# Patient Record
Sex: Male | Born: 1970 | Race: White | Hispanic: No | Marital: Married | Smoking: Current every day smoker
Health system: Southern US, Community
[De-identification: ages and names within clinical notes are randomized; demographics above are authoritative.]

## PROBLEM LIST (undated history)

## (undated) DIAGNOSIS — E079 Disorder of thyroid, unspecified: Secondary | ICD-10-CM

## (undated) DIAGNOSIS — R12 Heartburn: Secondary | ICD-10-CM

## (undated) DIAGNOSIS — M1990 Unspecified osteoarthritis, unspecified site: Secondary | ICD-10-CM

## (undated) DIAGNOSIS — E785 Hyperlipidemia, unspecified: Secondary | ICD-10-CM

## (undated) DIAGNOSIS — F419 Anxiety disorder, unspecified: Secondary | ICD-10-CM

## (undated) HISTORY — DX: Disorder of thyroid, unspecified: E07.9

## (undated) HISTORY — DX: Unspecified osteoarthritis, unspecified site: M19.90

## (undated) HISTORY — DX: Hyperlipidemia, unspecified: E78.5

## (undated) HISTORY — DX: Anxiety disorder, unspecified: F41.9

## (undated) HISTORY — DX: Heartburn: R12

---

## 2011-06-21 ENCOUNTER — Encounter: Payer: Self-pay | Admitting: Student

## 2011-06-21 ENCOUNTER — Emergency Department (HOSPITAL_BASED_OUTPATIENT_CLINIC_OR_DEPARTMENT_OTHER)
Admission: EM | Admit: 2011-06-21 | Discharge: 2011-06-22 | Disposition: A | Discharge status: Home / Self Care | Payer: Self-pay | Attending: Emergency Medicine | Admitting: Emergency Medicine

## 2011-06-21 DIAGNOSIS — S61219A Laceration without foreign body of unspecified finger without damage to nail, initial encounter: Secondary | ICD-10-CM

## 2011-06-21 DIAGNOSIS — W261XXA Contact with sword or dagger, initial encounter: Secondary | ICD-10-CM | POA: Insufficient documentation

## 2011-06-21 DIAGNOSIS — W260XXA Contact with knife, initial encounter: Secondary | ICD-10-CM | POA: Insufficient documentation

## 2011-06-21 DIAGNOSIS — S61209A Unspecified open wound of unspecified finger without damage to nail, initial encounter: Secondary | ICD-10-CM | POA: Insufficient documentation

## 2011-06-21 NOTE — ED Notes (Signed)
Laceration on tip of 5th digit, tetanus not up to date. Wet dressing applied to laceration

## 2011-06-21 NOTE — ED Notes (Signed)
No bleeding at present, area cleaned and finger soaking in betadine soak at this time. Pt states that last tetanus shot was 9 years ago.

## 2011-06-22 MED ORDER — BACITRACIN 500 UNIT/GM EX OINT
1.0000 "application " | TOPICAL_OINTMENT | Freq: Two times a day (BID) | CUTANEOUS | Status: DC
  Administered 2011-06-22: 1 via TOPICAL
  Filled 2011-06-22: qty 0.9

## 2011-06-22 MED ORDER — TETANUS-DIPHTH-ACELL PERTUSSIS 5-2.5-18.5 LF-MCG/0.5 IM SUSP
0.5000 mL | Freq: Once | INTRAMUSCULAR | Status: AC
  Administered 2011-06-22: 0.5 mL via INTRAMUSCULAR

## 2011-06-22 NOTE — ED Provider Notes (Addendum)
History    CC Finger Injury  HPI The patient was reaching into his pocket about 10:30 PM yesterday evening. He had a spring-loaded pocket knife in his pocket which had accidentally opened and he cut the tip of his right fifth finger when placing his hand in his pocket. There was moderate bleeding initially but this resolved with pressure. There is mild to moderate pain at the site worse with palpation. He denies numbness of the distal fingertip. He denies other injury. History reviewed. No pertinent past medical history.  History reviewed. No pertinent past surgical history.  History reviewed. No pertinent family history.  History  Substance Use Topics  . Smoking status: Not on file  . Smokeless tobacco: Not on file  . Alcohol Use: Not on file      Review of Systems  All other systems reviewed and are negative.    Physical Exam  BP 114/93  Pulse 87  Temp(Src) 97.9 F (36.6 C) (Oral)  Resp 16  Wt 215 lb (97.523 kg)  SpO2 100%  Physical Exam General: Well-developed, well-nourished male in no acute distress; appearance consistent with age of record HEENT: normocephalic, atraumatic Eyes: pupils equal round and reactive to light; extraocular muscles intact Neck: supple Heart: regular rate and rhythm; no murmurs, rubs or gallops Lungs: clear to auscultation bilaterally Abdomen: soft; nontender; nondistended; no masses or hepatosplenomegaly; bowel sounds present Extremities: No deformity; full range of motion; pulses normal; approximately 1 cm laceration at the tip of the right fifth finger on the volar side; sensation is intact distally Neurologic: Awake, alert and oriented;motor function intact in all extremities and symmetric;sensation grossly intact; no facial droop Psychiatric: Normal mood and affect   ED Course  LACERATION REPAIR Date/Time: 06/22/2011 1:43 AM Performed by: Hanley Seamen Authorized by: Hanley Seamen Consent: Verbal consent obtained. Consent given  by: patient Time out: Immediately prior to procedure a "time out" was called to verify the correct patient, procedure, equipment, support staff and site/side marked as required. Location: right 5th fingertip. Laceration length: 1 cm Foreign bodies: no foreign bodies Tendon involvement: none Nerve involvement: none Vascular damage: no Anesthesia: local infiltration Local anesthetic: lidocaine 2% with epinephrine Anesthetic total: 1 ml Patient sedated: no Preparation: Patient was prepped and draped in the usual sterile fashion. Irrigation solution: commercial wound irrigant. Irrigation method: jet lavage Amount of cleaning: standard Debridement: none Degree of undermining: none Skin closure: 5-0 nylon Number of sutures: 3 Technique: simple Approximation: close Approximation difficulty: simple Dressing: splint and antibiotic ointment Patient tolerance: Patient tolerated the procedure well with no immediate complications.    MDM       Hanley Seamen, MD 06/22/11 [id number]  Hanley Seamen, MD 07/23/11 [id number]

## 2021-05-07 ENCOUNTER — Other Ambulatory Visit: Payer: Self-pay | Admitting: Family Medicine

## 2021-05-07 ENCOUNTER — Other Ambulatory Visit: Payer: Self-pay

## 2021-05-07 ENCOUNTER — Ambulatory Visit
Admission: RE | Admit: 2021-05-07 | Discharge: 2021-05-07 | Disposition: A | Discharge status: Home / Self Care | Payer: BC Managed Care – PPO | Source: Ambulatory Visit | Attending: Family Medicine | Admitting: Family Medicine

## 2021-05-07 DIAGNOSIS — M2550 Pain in unspecified joint: Secondary | ICD-10-CM

## 2021-09-23 ENCOUNTER — Encounter: Payer: Self-pay | Admitting: Family Medicine

## 2021-09-23 ENCOUNTER — Other Ambulatory Visit: Payer: Self-pay

## 2021-09-23 ENCOUNTER — Ambulatory Visit (INDEPENDENT_AMBULATORY_CARE_PROVIDER_SITE_OTHER): Payer: No Typology Code available for payment source | Admitting: Family Medicine

## 2021-09-23 VITALS — BP 108/76 | HR 73 | Temp 98.1°F | Resp 17 | Ht 70.0 in | Wt 226.8 lb

## 2021-09-23 DIAGNOSIS — E039 Hypothyroidism, unspecified: Secondary | ICD-10-CM

## 2021-09-23 DIAGNOSIS — R002 Palpitations: Secondary | ICD-10-CM | POA: Diagnosis not present

## 2021-09-23 DIAGNOSIS — E782 Mixed hyperlipidemia: Secondary | ICD-10-CM

## 2021-09-23 DIAGNOSIS — F411 Generalized anxiety disorder: Secondary | ICD-10-CM

## 2021-09-23 DIAGNOSIS — R0789 Other chest pain: Secondary | ICD-10-CM | POA: Diagnosis not present

## 2021-09-23 DIAGNOSIS — F17210 Nicotine dependence, cigarettes, uncomplicated: Secondary | ICD-10-CM

## 2021-09-23 DIAGNOSIS — N529 Male erectile dysfunction, unspecified: Secondary | ICD-10-CM

## 2021-09-23 DIAGNOSIS — Z1211 Encounter for screening for malignant neoplasm of colon: Secondary | ICD-10-CM

## 2021-09-23 NOTE — Progress Notes (Signed)
Subjective:  Patient ID: Ryan Kelly, male    DOB: [birth year]  Age: 50 y.o. MRN: [id number]  CC:  Chief Complaint  Patient presents with   Establish Care    Pt here for establish care no concerns notes history of anxiety     HPI Ryan Kelly presents for  New patient to establish care.  Left prior job - new insurance - had to leave prior PCP at [facility] Dr. Daron Kelly.   Generalized anxiety disorder.  Note reviewed from Dr. Daron Kelly in 05/06/21.  Treated with buspirone as needed. Daily a month ago - 2-3 pills when anxious. none in past few weeks. More relaxed recently. Finished remodel of house, ready to place on market. Flips houses now. Prior work in [employer], some hand issues - trigger finger, and hand arthritis - better now. Only occasional symptoms now. Walking and riding bike.   Hypothyroidism: Synthroid 137 mcg per day.  TSH 3.49 on 01/19/21.  Taking medication daily.  No new hot or cold intolerance. No new hair or skin changes, heart palpitations or new fatigue. No new weight changes.   Heart palpitations: Notes with marijuana use. Heart beats hard at times, twinge of chest discomfort - notes with anxiety.  No personal or FH of early heart disease known.  No regular exercise - would like to restart EKG ok few years ok.   Nicotine addiction: 1ppd, no plan to quit at this time. Has tried quitting in past.   Hyperlipidemia: Treated with fenofibrate daily. Past few years. No recent labs.  Lipid profile January 19, 2021, total cholesterol 260, triglycerides 604, LDL 121, HDL 40 No prior statin.   Erectile dysfunction: Takes 50mg  sildenafil  when needed. No CP with exertion or intercourse. No vision. No refill needed.   HM: Colon: grandfather had colon cancer. Requests colonoscopy.  Declines flu vaccine.    History There are no problems to display for this patient.  Past Medical History:  Diagnosis Date   Arthritis    Hyperlipidemia    Thyroid disease     History reviewed. No pertinent surgical history. No Known Allergies Prior to Admission medications   Medication Sig Start Date End Date Taking? Authorizing Provider  busPIRone (BUSPAR) 5 MG tablet Take 1 tablet by mouth daily as needed. 05/06/21  Yes [provider]  fenofibrate micronized (LOFIBRA) 200 MG capsule Take 1 capsule by mouth daily. 02/01/21  Yes [provider]  levothyroxine (SYNTHROID) 137 MCG tablet Take 137 mcg by mouth daily. 11/12/20  Yes [provider]   Social History   Socioeconomic History   Marital status: Single    Spouse name: Not on file   Number of children: Not on file   Years of education: Not on file   Highest education level: Not on file  Occupational History   Not on file  Tobacco Use   Smoking status: Every Day    Types: Cigarettes   Smokeless tobacco: Never  Substance and Sexual Activity   Alcohol use: Not on file   Drug use: Yes    Types: Marijuana   Sexual activity: Yes    Birth control/protection: None  Other Topics Concern   Not on file  Social History Narrative   Not on file   Social Determinants of Health   Financial Resource Strain: Not on file  Food Insecurity: Not on file  Transportation Needs: Not on file  Physical Activity: Not on file  Stress: Not on file  Social Connections: Not on file  Intimate Partner Violence: Not on file    Review of Systems Per HPI   Objective:   Vitals:   09/23/21 1514  BP: 108/76  Pulse: 73  Resp: 17  Temp: 98.1 F (36.7 C)  TempSrc: Temporal  SpO2: 96%  Weight: 226 lb 12.8 oz (102.9 kg)  Height: 5\' 10"  (1.778 m)     Physical Exam Vitals reviewed.  Constitutional:      Appearance: He is well-developed.  HENT:     Head: Normocephalic and atraumatic.  Neck:     Vascular: No carotid bruit or JVD.  Cardiovascular:     Rate and Rhythm: Normal rate and regular rhythm.     Heart sounds: Normal heart sounds. No murmur heard. Pulmonary:     Effort:  Pulmonary effort is normal.     Breath sounds: Normal breath sounds. No rales.  Musculoskeletal:     Right lower leg: No edema.     Left lower leg: No edema.  Skin:    General: Skin is warm and dry.  Neurological:     Mental Status: He is alert and oriented to person, place, and time.  Psychiatric:        Mood and Affect: Mood normal.    EKG: Sinus rhythm, rate 66 bpm.  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  Some flat T waves in lead III, V6.  Previous EKG 10/24/2017 read as normal, but unable to see actual image to compare.  Assessment & Plan:  Ryan Kelly is a 50 y.o. male . Mixed hyperlipidemia - Plan: Comprehensive metabolic panel, Lipid panel  -Updated labs recommended, plans for repeat fasting lab work.  Risks of significant hypertriglyceridemia discussed especially over 500.  May need to adjust doses or add statin.  Palpitations - Plan: EKG 12-Lead, Ambulatory referral to Cardiology, CBC Chest discomfort - Plan: EKG 12-Lead, Ambulatory referral to Cardiology  -Atypical symptoms, fleeting sensations of palpitations, most likely during times of anxiety.  Less likely cardiac cause but does have cardiac risk factors of nicotine addiction, hyperlipidemia.  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  Refer to cardiology, ER/RTC precautions given.  Check labs for palpitations.  Erectile dysfunction, unspecified erectile dysfunction type  - viagra used - use lowest effective dose. Side effects discussed (including but not limited to headache/flushing, blue discoloration of vision, possible vascular steal and risk of cardiac effects if underlying unknown coronary artery disease, and permanent sensorineural hearing loss). Understanding expressed.  GAD (generalized anxiety disorder)  -Recommend trying BuSpar daily, or 5 mg twice daily to help suppress anxiety symptoms, potentially decreasing chest symptoms/palpitations as above.  6-week  follow-up.  Screening for colon cancer - Plan: Ambulatory referral to Gastroenterology  Hypothyroidism, unspecified type - Plan: TSH  -Continue same dose meds, check updated labs, especially with palpitations.  Cigarette nicotine dependence without complication  -Contemplative stage, advised I am happy to help with cessation when he is ready.  No orders of the defined types were placed in this encounter.  Patient Instructions  Please return for fasting labs in the next week. If triglycerides are still high, we need to look at other options.   Continue buspirone - I do recommend daily use  or twice daily consistently for now to see if that lessens chest symptoms.   I will refer you to cardiology, but the chest symptoms less likely due to your heart. If starting back on exercise - start low and go slow.  I will refer you for colonoscopy.   Return to the clinic or go to the nearest emergency room if any of your symptoms worsen or new symptoms occur.  Palpitations Palpitations are feelings that your heartbeat is irregular or is faster than normal. It may feel like your heart is fluttering or skipping a beat. Palpitations are usually not a serious problem. They may be caused by many things, including smoking, caffeine, alcohol, stress, and certain medicines or drugs. Most causes of palpitations are not serious. However, some palpitations can be a sign of a serious problem. You may need further tests to rule out serious medical problems. Follow these instructions at home:   Pay attention to any changes in your condition. Take these actions to help manage your symptoms: Eating and drinking Avoid foods and drinks that may cause palpitations. These may include: Caffeinated coffee, tea, soft drinks, diet pills, and energy drinks. Chocolate. Alcohol. Lifestyle Take steps to reduce your stress and anxiety. Things that can help you relax include: Yoga. Mind-body activities, such as deep  breathing, meditation, or using words and images to create positive thoughts (guided imagery). Physical activity, such as swimming, jogging, or walking. Tell your health care provider if your palpitations increase with activity. If you have chest pain or shortness of breath with activity, do not continue the activity until you are seen by your health care provider. Biofeedback. This is a method that helps you learn to use your mind to control things in your body, such as your heartbeat. Do not use drugs, including cocaine or ecstasy. Do not use marijuana. Get plenty of rest and sleep. Keep a regular bed time. General instructions Take over-the-counter and prescription medicines only as told by your health care provider. Do not use any products that contain nicotine or tobacco, such as cigarettes and e-cigarettes. If you need help quitting, ask your health care provider. Keep all follow-up visits as told by your health care provider. This is important. These may include visits for further testing if palpitations do not go away or get worse. Contact a health care provider if you: Continue to have a fast or irregular heartbeat after 24 hours. Notice that your palpitations occur more often. Get help right away if you: Have chest pain or shortness of breath. Have a severe headache. Feel dizzy or you faint. Summary Palpitations are feelings that your heartbeat is irregular or is faster than normal. It may feel like your heart is fluttering or skipping a beat. Palpitations may be caused by many things, including smoking, caffeine, alcohol, stress, certain medicines, and drugs. Although most causes of palpitations are not serious, some causes can be a sign of a serious medical problem. Get help right away if you faint or have chest pain, shortness of breath, a severe headache, or dizziness. This information is not intended to replace advice given to you by your health care provider. Make sure you  discuss any questions you have with your health care provider. Document Revised: 12/27/2017 Document Reviewed: 01/03/2018 Elsevier Patient Education  2022 [place] Adult After being diagnosed with an anxiety disorder, you may be relieved to know why you have felt or behaved a certain way. You may also feel overwhelmed about the treatment ahead and what it will mean for your life. With care and support, you can manage this condition and recover from it. How to manage lifestyle changes Managing stress and anxiety Stress is your body's reaction to life changes and  events, both good and bad. Most stress will last just a few hours, but stress can be ongoing and can lead to more than just stress. Although stress can play a major role in anxiety, it is not the same as anxiety. Stress is usually caused by something external, such as a deadline, test, or competition. Stress normally passes after the triggering event has ended.  Anxiety is caused by something internal, such as imagining a terrible outcome or worrying that something will go wrong that will devastate you. Anxiety often does not go away even after the triggering event is over, and it can become long-term (chronic) worry. It is important to understand the differences between stress and anxiety and to manage your stress effectively so that it does not lead to an anxious response. Talk with your health care provider or a counselor to learn more about reducing anxiety and stress. He or she may suggest tension reduction techniques, such as: Music therapy. This can include creating or listening to music that you enjoy and that inspires you. Mindfulness-based meditation. This involves being aware of your normal breaths while not trying to control your breathing. It can be done while sitting or walking. Centering prayer. This involves focusing on a word, phrase, or sacred image that means something to you and brings you  peace. Deep breathing. To do this, expand your stomach and inhale slowly through your nose. Hold your breath for 3-5 seconds. Then exhale slowly, letting your stomach muscles relax. Self-talk. This involves identifying thought patterns that lead to anxiety reactions and changing those patterns. Muscle relaxation. This involves tensing muscles and then relaxing them. Choose a tension reduction technique that suits your lifestyle and personality. These techniques take time and practice. Set aside 5-15 minutes a day to do them. Therapists can Kelly counseling and training in these techniques. The training to help with anxiety may be covered by some insurance plans. Other things you can do to manage stress and anxiety include: Keeping a stress/anxiety diary. This can help you learn what triggers your reaction and then learn ways to manage your response. Thinking about how you react to certain situations. You may not be able to control everything, but you can control your response. Making time for activities that help you relax and not feeling guilty about spending your time in this way. Visual imagery and yoga can help you stay calm and relax.  Medicines Medicines can help ease symptoms. Medicines for anxiety include: Anti-anxiety drugs. Antidepressants. Medicines are often used as a primary treatment for anxiety disorder. Medicines will be prescribed by a health care provider. When used together, medicines, psychotherapy, and tension reduction techniques may be the most effective treatment. Relationships Relationships can play a big part in helping you recover. Try to spend more time connecting with trusted friends and family members. Consider going to couples counseling, taking family education classes, or going to family therapy. Therapy can help you and others better understand your condition. How to recognize changes in your anxiety Everyone responds differently to treatment for anxiety. Recovery  from anxiety happens when symptoms decrease and stop interfering with your daily activities at home or work. This may mean that you will start to: Have better concentration and focus. Worry will interfere less in your daily thinking. Sleep better. Be less irritable. Have more energy. Have improved memory. It is important to recognize when your condition is getting worse. Contact your health care provider if your symptoms interfere with home or work and you feel  like your condition is not improving. Follow these instructions at home: Activity Exercise. Most adults should do the following: Exercise for at least 150 minutes each week. The exercise should increase your heart rate and make you sweat (moderate-intensity exercise). Strengthening exercises at least twice a week. Get the right amount and quality of sleep. Most adults need 7-9 hours of sleep each night. Lifestyle  Eat a healthy diet that includes plenty of vegetables, fruits, whole grains, low-fat dairy products, and lean protein. Do not eat a lot of foods that are high in solid fats, added sugars, or salt. Make choices that simplify your life. Do not use any products that contain nicotine or tobacco, such as cigarettes, e-cigarettes, and chewing tobacco. If you need help quitting, ask your health care provider. Avoid caffeine, alcohol, and certain over-the-counter cold medicines. These may make you feel worse. Ask your pharmacist which medicines to avoid. General instructions Take over-the-counter and prescription medicines only as told by your health care provider. Keep all follow-up visits as told by your health care provider. This is important. Where to find support You can get help and support from these sources: Self-help groups. Online and [employer]. A trusted spiritual leader. Couples counseling. Family education classes. Family therapy. Where to find more information You may find that joining a support  group helps you deal with your anxiety. The following sources can help you locate counselors or support groups near you: [place]: www.mentalhealth[place].net Anxiety and Depression Association of [place] (ADAA): https://www.clark.net/ National Alliance on Mental Illness (NAMI): www.nami.org Contact a health care provider if you: Have a hard time staying focused or finishing daily tasks. Spend many hours a day feeling worried about everyday life. Become exhausted by worry. Start to have headaches, feel tense, or have nausea. Urinate more than normal. Have diarrhea. Get help right away if you have: A racing heart and shortness of breath. Thoughts of hurting yourself or others. If you ever feel like you may hurt yourself or others, or have thoughts about taking your own life, get help right away. You can go to your nearest emergency department or call: Your local emergency services (911 in the [place] A suicide crisis helpline, such as the [place] at (954)678-9367. This is open 24 hours a day. Summary Taking steps to learn and use tension reduction techniques can help calm you and help prevent triggering an anxiety reaction. When used together, medicines, psychotherapy, and tension reduction techniques may be the most effective treatment. Family, friends, and partners can play a big part in helping you recover from an anxiety disorder. This information is not intended to replace advice given to you by your health care provider. Make sure you discuss any questions you have with your health care provider. Document Revised: 04/23/2019 Document Reviewed: 04/23/2019 Elsevier Patient Education  2022 [place],   Merri Ray, MD [place] 09/23/21 6:10 PM

## 2021-09-23 NOTE — Patient Instructions (Addendum)
Please return for fasting labs in the next week. If triglycerides are still high, we need to look at other options.   Continue buspirone - I do recommend daily use  or twice daily consistently for now to see if that lessens chest symptoms.   I will refer you to cardiology, but the chest symptoms less likely due to your heart. If starting back on exercise - start low and go slow.   I will refer you for colonoscopy.   Return to the clinic or go to the nearest emergency room if any of your symptoms worsen or new symptoms occur.  Palpitations Palpitations are feelings that your heartbeat is irregular or is faster than normal. It may feel like your heart is fluttering or skipping a beat. Palpitations are usually not a serious problem. They may be caused by many things, including smoking, caffeine, alcohol, stress, and certain medicines or drugs. Most causes of palpitations are not serious. However, some palpitations can be a sign of a serious problem. You may need further tests to rule out serious medical problems. Follow these instructions at home:   Pay attention to any changes in your condition. Take these actions to help manage your symptoms: Eating and drinking Avoid foods and drinks that may cause palpitations. These may include: Caffeinated coffee, tea, soft drinks, diet pills, and energy drinks. Chocolate. Alcohol. Lifestyle Take steps to reduce your stress and anxiety. Things that can help you relax include: Yoga. Mind-body activities, such as deep breathing, meditation, or using words and images to create positive thoughts (guided imagery). Physical activity, such as swimming, jogging, or walking. Tell your health care provider if your palpitations increase with activity. If you have chest pain or shortness of breath with activity, do not continue the activity until you are seen by your health care provider. Biofeedback. This is a method that helps you learn to use your mind to control  things in your body, such as your heartbeat. Do not use drugs, including cocaine or ecstasy. Do not use marijuana. Get plenty of rest and sleep. Keep a regular bed time. General instructions Take over-the-counter and prescription medicines only as told by your health care provider. Do not use any products that contain nicotine or tobacco, such as cigarettes and e-cigarettes. If you need help quitting, ask your health care provider. Keep all follow-up visits as told by your health care provider. This is important. These may include visits for further testing if palpitations do not go away or get worse. Contact a health care provider if you: Continue to have a fast or irregular heartbeat after 24 hours. Notice that your palpitations occur more often. Get help right away if you: Have chest pain or shortness of breath. Have a severe headache. Feel dizzy or you faint. Summary Palpitations are feelings that your heartbeat is irregular or is faster than normal. It may feel like your heart is fluttering or skipping a beat. Palpitations may be caused by many things, including smoking, caffeine, alcohol, stress, certain medicines, and drugs. Although most causes of palpitations are not serious, some causes can be a sign of a serious medical problem. Get help right away if you faint or have chest pain, shortness of breath, a severe headache, or dizziness. This information is not intended to replace advice given to you by your health care provider. Make sure you discuss any questions you have with your health care provider. Document Revised: 12/27/2017 Document Reviewed: 01/03/2018 Elsevier Patient Education  2022 [employer].  Managing Anxiety, Adult After being diagnosed with an anxiety disorder, you may be relieved to know why you have felt or behaved a certain way. You may also feel overwhelmed about the treatment ahead and what it will mean for your life. With care and support, you can manage  this condition and recover from it. How to manage lifestyle changes Managing stress and anxiety Stress is your body's reaction to life changes and events, both good and bad. Most stress will last just a few hours, but stress can be ongoing and can lead to more than just stress. Although stress can play a major role in anxiety, it is not the same as anxiety. Stress is usually caused by something external, such as a deadline, test, or competition. Stress normally passes after the triggering event has ended.  Anxiety is caused by something internal, such as imagining a terrible outcome or worrying that something will go wrong that will devastate you. Anxiety often does not go away even after the triggering event is over, and it can become long-term (chronic) worry. It is important to understand the differences between stress and anxiety and to manage your stress effectively so that it does not lead to an anxious response. Talk with your health care provider or a counselor to learn more about reducing anxiety and stress. He or she may suggest tension reduction techniques, such as: Music therapy. This can include creating or listening to music that you enjoy and that inspires you. Mindfulness-based meditation. This involves being aware of your normal breaths while not trying to control your breathing. It can be done while sitting or walking. Centering prayer. This involves focusing on a word, phrase, or sacred image that means something to you and brings you peace. Deep breathing. To do this, expand your stomach and inhale slowly through your nose. Hold your breath for 3-5 seconds. Then exhale slowly, letting your stomach muscles relax. Self-talk. This involves identifying thought patterns that lead to anxiety reactions and changing those patterns. Muscle relaxation. This involves tensing muscles and then relaxing them. Choose a tension reduction technique that suits your lifestyle and personality. These  techniques take time and practice. Set aside 5-15 minutes a day to do them. Therapists can offer counseling and training in these techniques. The training to help with anxiety may be covered by some insurance plans. Other things you can do to manage stress and anxiety include: Keeping a stress/anxiety diary. This can help you learn what triggers your reaction and then learn ways to manage your response. Thinking about how you react to certain situations. You may not be able to control everything, but you can control your response. Making time for activities that help you relax and not feeling guilty about spending your time in this way. Visual imagery and yoga can help you stay calm and relax.  Medicines Medicines can help ease symptoms. Medicines for anxiety include: Anti-anxiety drugs. Antidepressants. Medicines are often used as a primary treatment for anxiety disorder. Medicines will be prescribed by a health care provider. When used together, medicines, psychotherapy, and tension reduction techniques may be the most effective treatment. Relationships Relationships can play a big part in helping you recover. Try to spend more time connecting with trusted friends and family members. Consider going to couples counseling, taking family education classes, or going to family therapy. Therapy can help you and others better understand your condition. How to recognize changes in your anxiety Everyone responds differently to treatment for anxiety. Recovery from anxiety happens  when symptoms decrease and stop interfering with your daily activities at home or work. This may mean that you will start to: Have better concentration and focus. Worry will interfere less in your daily thinking. Sleep better. Be less irritable. Have more energy. Have improved memory. It is important to recognize when your condition is getting worse. Contact your health care provider if your symptoms interfere with home or work  and you feel like your condition is not improving. Follow these instructions at home: Activity Exercise. Most adults should do the following: Exercise for at least 150 minutes each week. The exercise should increase your heart rate and make you sweat (moderate-intensity exercise). Strengthening exercises at least twice a week. Get the right amount and quality of sleep. Most adults need 7-9 hours of sleep each night. Lifestyle  Eat a healthy diet that includes plenty of vegetables, fruits, whole grains, low-fat dairy products, and lean protein. Do not eat a lot of foods that are high in solid fats, added sugars, or salt. Make choices that simplify your life. Do not use any products that contain nicotine or tobacco, such as cigarettes, e-cigarettes, and chewing tobacco. If you need help quitting, ask your health care provider. Avoid caffeine, alcohol, and certain over-the-counter cold medicines. These may make you feel worse. Ask your pharmacist which medicines to avoid. General instructions Take over-the-counter and prescription medicines only as told by your health care provider. Keep all follow-up visits as told by your health care provider. This is important. Where to find support You can get help and support from these sources: Self-help groups. Online and [employer]. A trusted spiritual leader. Couples counseling. Family education classes. Family therapy. Where to find more information You may find that joining a support group helps you deal with your anxiety. The following sources can help you locate counselors or support groups near you: [place]: www.mentalhealthamerica.net Anxiety and Depression Association of [place] (ADAA): https://www.clark.net/ National Alliance on Mental Illness (NAMI): www.nami.org Contact a health care provider if you: Have a hard time staying focused or finishing daily tasks. Spend many hours a day feeling worried about everyday  life. Become exhausted by worry. Start to have headaches, feel tense, or have nausea. Urinate more than normal. Have diarrhea. Get help right away if you have: A racing heart and shortness of breath. Thoughts of hurting yourself or others. If you ever feel like you may hurt yourself or others, or have thoughts about taking your own life, get help right away. You can go to your nearest emergency department or call: Your local emergency services (911 in the [place] A suicide crisis helpline, such as the [place] at (229)840-7978. This is open 24 hours a day. Summary Taking steps to learn and use tension reduction techniques can help calm you and help prevent triggering an anxiety reaction. When used together, medicines, psychotherapy, and tension reduction techniques may be the most effective treatment. Family, friends, and partners can play a big part in helping you recover from an anxiety disorder. This information is not intended to replace advice given to you by your health care provider. Make sure you discuss any questions you have with your health care provider. Document Revised: 04/23/2019 Document Reviewed: 04/23/2019 Elsevier Patient Education  [place].

## 2021-09-30 ENCOUNTER — Other Ambulatory Visit (INDEPENDENT_AMBULATORY_CARE_PROVIDER_SITE_OTHER): Payer: No Typology Code available for payment source

## 2021-09-30 ENCOUNTER — Other Ambulatory Visit: Payer: Self-pay

## 2021-09-30 DIAGNOSIS — R002 Palpitations: Secondary | ICD-10-CM | POA: Diagnosis not present

## 2021-09-30 DIAGNOSIS — E782 Mixed hyperlipidemia: Secondary | ICD-10-CM | POA: Diagnosis not present

## 2021-09-30 DIAGNOSIS — E039 Hypothyroidism, unspecified: Secondary | ICD-10-CM

## 2021-09-30 LAB — TSH: TSH: 6.05 u[IU]/mL — ABNORMAL HIGH (ref 0.35–5.50)

## 2021-09-30 LAB — COMPREHENSIVE METABOLIC PANEL
ALT: 34 U/L (ref 0–53)
AST: 25 U/L (ref 0–37)
Albumin: 4.6 g/dL (ref 3.5–5.2)
Alkaline Phosphatase: 33 U/L — ABNORMAL LOW (ref 39–117)
BUN: 17 mg/dL (ref 6–23)
CO2: 25 mEq/L (ref 19–32)
Calcium: 9.3 mg/dL (ref 8.4–10.5)
Chloride: 104 mEq/L (ref 96–112)
Creatinine, Ser: 0.99 mg/dL (ref 0.40–1.50)
GFR: 89.79 mL/min (ref >60.00)
Glucose, Bld: 83 mg/dL (ref 70–99)
Potassium: 4 mEq/L (ref 3.5–5.1)
Sodium: 138 mEq/L (ref 135–145)
Total Bilirubin: 0.6 mg/dL (ref 0.2–1.2)
Total Protein: 7.2 g/dL (ref 6.0–8.3)

## 2021-09-30 LAB — LIPID PANEL
Cholesterol: 214 mg/dL — ABNORMAL HIGH (ref 0–200)
HDL: 55.5 mg/dL (ref >39.00)
NonHDL: 158.61
Total CHOL/HDL Ratio: 4
Triglycerides: 246 mg/dL — ABNORMAL HIGH (ref 0.0–149.0)
VLDL: 49.2 mg/dL — ABNORMAL HIGH (ref 0.0–40.0)

## 2021-09-30 LAB — CBC
HCT: 42.9 % (ref 39.0–52.0)
Hemoglobin: 14.7 g/dL (ref 13.0–17.0)
MCHC: 34.2 g/dL (ref 30.0–36.0)
MCV: 89.9 fl (ref 78.0–100.0)
Platelets: 229 10*3/uL (ref 150.0–400.0)
RBC: 4.77 Mil/uL (ref 4.22–5.81)
RDW: 13.3 % (ref 11.5–15.5)
WBC: 7.8 10*3/uL (ref 4.0–10.5)

## 2021-09-30 LAB — LDL CHOLESTEROL, DIRECT: Direct LDL: 131 mg/dL

## 2021-10-07 ENCOUNTER — Encounter: Payer: Self-pay | Admitting: Gastroenterology

## 2021-10-27 ENCOUNTER — Encounter: Payer: Self-pay | Admitting: Internal Medicine

## 2021-10-27 ENCOUNTER — Other Ambulatory Visit: Payer: Self-pay

## 2021-10-27 ENCOUNTER — Ambulatory Visit (INDEPENDENT_AMBULATORY_CARE_PROVIDER_SITE_OTHER): Payer: No Typology Code available for payment source | Admitting: Internal Medicine

## 2021-10-27 VITALS — BP 110/77 | Ht 70.0 in | Wt 232.0 lb

## 2021-10-27 DIAGNOSIS — R0789 Other chest pain: Secondary | ICD-10-CM

## 2021-10-27 DIAGNOSIS — R002 Palpitations: Secondary | ICD-10-CM | POA: Diagnosis not present

## 2021-10-27 NOTE — Patient Instructions (Signed)
Medication Instructions:  Your physician recommends that you continue on your current medications as directed. Please refer to the Current Medication list given to you today.  *If you need a refill on your cardiac medications before your next appointment, please call your pharmacy*   Follow-Up: At [facility], you and your health needs are our priority.  As part of our continuing mission to provide you with exceptional heart care, we have created designated Provider Care Teams.  These Care Teams include your primary Cardiologist (physician) and Advanced Practice Providers (APPs -  Physician Assistants and Nurse Practitioners) who all work together to provide you with the care you need, when you need it.  We recommend signing up for the patient portal called "MyChart".  Sign up information is provided on this After Visit Summary.  MyChart is used to connect with patients for Virtual Visits (Telemedicine).  Patients are able to view lab/test results, encounter notes, upcoming appointments, etc.  Non-urgent messages can be sent to your provider as well.   To learn more about what you can do with MyChart, go to [email or web address].    Your next appointment:   We will see you on an as needed basis.  Provider:   Mary Branch, MD 

## 2021-10-27 NOTE — Progress Notes (Signed)
Cardiology Office Note:    Date:  10/27/2021   ID:  Arnoldo Morale, DOB [id number], MRN [id number]  PCP:  Wendie Agreste, MD   [facility] Providers Cardiologist:  None     Referring MD: Wendie Agreste, MD   No chief complaint on file. Chest pain  History of Present Illness:    Ryan Kelly is a 50 y.o. 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 It's been going on for a year. The pain lasts for a second on the L side. He noted it with panic attacks. He knew someone younger than him that died of a heart attack. He noticed it with MJ. It occurs in the evening. Non exertional. No dyspnea. He did a seven mile hike with no dyspnea. When riding his bike he does not notice it. 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  He has no cardiac dx hx. No LH, no syncope. No persistent dizziness. No hx of hypertension. He smokes cigarettes he is not working on quitting.  Mother has diabetes. Father is healthy. He has a brother and he is healthy.  CVD Risk/Equivalent: HLD- yes LDL 121, HDL 40 HTN- no PAD- No DMII no Smoker-yes Stroke-no Premature Family History- no   Past Medical History:  Diagnosis Date   Arthritis    Hyperlipidemia    Thyroid disease     No past surgical history on file.  Current Medications: No outpatient medications have been marked as taking for the 10/27/21 encounter (Appointment) with Janina Mayo, MD.     Allergies:   Patient has no known allergies.   Social History   Socioeconomic History   Marital status: Single    Spouse name: Not on file   Number of children: Not on file   Years of education: Not on file   Highest education level: Not on file  Occupational History   Not on file  Tobacco Use   Smoking status: Every Day    Types: Cigarettes    Smokeless tobacco: Never  Substance and Sexual Activity   Alcohol use: Not on file   Drug use: Yes    Types: Marijuana   Sexual activity: Yes    Birth control/protection: None  Other Topics Concern   Not on file  Social History Narrative   Not on file   Social Determinants of Health   Financial Resource Strain: Not on file  Food Insecurity: Not on file  Transportation Needs: Not on file  Physical Activity: Not on file  Stress: Not on file  Social Connections: Not on file     Family History: The patient's family history includes Birth defects in his maternal grandmother and paternal grandmother; Diabetes in his mother.  ROS:   Please see the history of present illness.     All other systems reviewed and are negative.  EKGs/Labs/Other Studies Reviewed:    The following studies were reviewed today:   EKG:  EKG is  ordered today.  The ekg ordered today demonstrates   NSR, no ischemic change  Recent Labs: 09/30/2021: ALT 34; BUN 17; Creatinine, Ser 0.99; Hemoglobin 14.7; Platelets 229.0; Potassium 4.0; Sodium 138; TSH 6.05  Recent Lipid Panel    Component Value Date/Time   CHOL 214 (H) 09/30/2021 0840   TRIG 246.0 (H) 09/30/2021 0840   HDL  55.50 09/30/2021 0840   CHOLHDL 4 09/30/2021 0840   VLDL 49.2 (H) 09/30/2021 0840   LDLDIRECT 131.0 09/30/2021 0840     Risk Assessment/Calculations:     The 10-year ASCVD risk score (Arnett DK, et al., 2019) is: 5.5%   Values used to calculate the score:     Age: 50 years     Sex: Male     Is Non-Hispanic African American: No     Diabetic: No     Tobacco smoker: Yes     Systolic Blood Pressure: 155 mmHg     Is BP treated: No     HDL Cholesterol: 55.5 mg/dL     Total Cholesterol: 214 mg/dL       Physical Exam:    VS:  There were no vitals taken for this visit.    Wt Readings from Last 3 Encounters:  09/23/21 226 lb 12.8 oz (102.9 kg)  06/21/11 215 lb (97.5 kg)     GEN:  Well nourished, well developed in no  acute distress HEENT: Normal NECK: No JVD; No carotid bruits LYMPHATICS: No lymphadenopathy CARDIAC: RRR, no murmurs, rubs, gallops RESPIRATORY:  Clear to auscultation without rales, wheezing or rhonchi  ABDOMEN: Soft, non-tender, non-distended MUSCULOSKELETAL:  No edema; No deformity  SKIN: Warm and dry NEUROLOGIC:  Alert and oriented x 3 PSYCHIATRIC:  Normal affect   ASSESSMENT:    #Atypical Chest pain: His CVD risk is low and he does not have high risk features including exertional angina or dyspnea. I discussed symptoms of anginal chest pain. His pain is atypical c/w MSK pain    PLAN:    In order of problems listed above:  Follow up PRN           Medication Adjustments/Labs and Tests Ordered: Current medicines are reviewed at length with the patient today.  Concerns regarding medicines are outlined above.  No orders of the defined types were placed in this encounter.  No orders of the defined types were placed in this encounter.   There are no Patient Instructions on file for this visit.   Signed, Janina Mayo, MD  10/27/2021 8:15 AM    [facility]

## 2021-11-05 ENCOUNTER — Encounter: Payer: Self-pay | Admitting: Family Medicine

## 2021-11-05 ENCOUNTER — Ambulatory Visit (INDEPENDENT_AMBULATORY_CARE_PROVIDER_SITE_OTHER): Payer: No Typology Code available for payment source | Admitting: Family Medicine

## 2021-11-05 VITALS — BP 118/60 | HR 72 | Temp 98.1°F | Resp 17 | Ht 70.0 in | Wt 234.2 lb

## 2021-11-05 DIAGNOSIS — R002 Palpitations: Secondary | ICD-10-CM | POA: Diagnosis not present

## 2021-11-05 DIAGNOSIS — E782 Mixed hyperlipidemia: Secondary | ICD-10-CM

## 2021-11-05 DIAGNOSIS — E781 Pure hyperglyceridemia: Secondary | ICD-10-CM | POA: Diagnosis not present

## 2021-11-05 DIAGNOSIS — E039 Hypothyroidism, unspecified: Secondary | ICD-10-CM

## 2021-11-05 DIAGNOSIS — F411 Generalized anxiety disorder: Secondary | ICD-10-CM

## 2021-11-05 MED ORDER — FENOFIBRATE MICRONIZED 200 MG PO CAPS
200.0000 mg | ORAL_CAPSULE | Freq: Every day | ORAL | 1 refills | Status: DC
Start: 2021-11-05 — End: 2022-03-07

## 2021-11-05 MED ORDER — LEVOTHYROXINE SODIUM 137 MCG PO TABS
137.0000 ug | ORAL_TABLET | Freq: Every day | ORAL | 1 refills | Status: DC
Start: 2021-11-05 — End: 2022-03-07

## 2021-11-05 MED ORDER — BUSPIRONE HCL 5 MG PO TABS: 5.0000 mg | ORAL_TABLET | Freq: Every day | ORAL | 1 refills | Status: DC | PRN

## 2021-11-05 NOTE — Patient Instructions (Addendum)
Ok to use daily Buspar, and if anxiety increases can take twice per day.   Same dose thyroid med for now - recheck levels at lab only visit in 1 month. Follow up with me in 4 months - fasting labs at that time.   Exercise (start low and go slow) and diet changes may help triglycerides - no med changes for now.   Let me know if any new symptoms. Take care.   High Triglycerides Eating Plan Triglycerides are a type of fat in the blood. High levels of triglycerides can increase your risk of heart disease and stroke. If your triglyceride levels are high, choosing the right foods can help lower your triglycerides and keep your heart healthy. Work with your health care provider or a dietitian to develop an eating plan that is right for you. What are tips for following this plan? General guidelines  Lose weight, if you are overweight. For most people, losing 5-10 lb (2-5 kg) helps lower triglyceride levels. A weight-loss plan may include: 30 minutes of exercise at least 5 days a week. Reducing the amount of calories, sugar, and fat you eat. Eat a wide variety of fresh fruits, vegetables, and whole grains. These foods are high in fiber. Eat foods that contain healthy fats, such as fatty fish, nuts, seeds, and olive oil. Avoid foods that are high in added sugar, added salt (sodium), and saturated fat. Avoid low-fiber, refined carbohydrates such as white bread, crackers, noodles, and white rice. Avoid foods with trans fats or partially hydrogenated oils, such as fried foods or stick margarine. If you drink alcohol: Limit how much you have to: 0-1 drink a day for women who are not pregnant. 0-2 drinks a day for men. Your health care provider may recommend that you drink less than these amounts depending on your overall health. Know how much alcohol is in a drink. In the [place] one drink equals one 12 oz bottle of beer (355 mL), one 5 oz glass of wine (148 mL), or one 1 oz glass of hard liquor (44  mL). Reading food labels Check food labels for: The amount of saturated fat. Choose foods with no or very little saturated fat (less than 2 g). The amount of trans fat. Choose foods with no transfat. The amount of cholesterol. Choose foods that are low in cholesterol. The amount of sodium. Choose foods with less than 140 milligrams (mg) per serving. Shopping Buy dairy products labeled as nonfat (skim) or low-fat (1%). Avoid buying processed or prepackaged foods. These are often high in added sugar, sodium, and fat. Cooking Choose healthy fats when cooking, such as olive oil, avocado oil, or canola oil. Cook foods using lower fat methods, such as baking, broiling, boiling, or grilling. Make your own sauces, dressings, and marinades when possible, instead of buying them. Store-bought sauces, dressings, and marinades are often high in sodium and sugar. Meal planning Eat more home-cooked food and less restaurant, buffet, and fast food. Eat fatty fish at least 2 times each week. Examples of fatty fish include salmon, trout, sardines, mackerel, tuna, and herring. If you eat whole eggs, do not eat more than 4 egg yolks per week. What foods should I eat? Fruits All fresh, canned (in natural juice), or frozen fruits. Vegetables Fresh or frozen vegetables. Low-sodium canned vegetables. Grains Whole wheat or whole grain breads, crackers, cereals, and pasta. Unsweetened oatmeal. Bulgur. Barley. Quinoa. Brown rice. Whole wheat flour tortillas. Meats and other proteins Skinless chicken or [place]. Ground chicken  or [place]. Lean cuts of pork, trimmed of fat. Fish and seafood, especially salmon, trout, and herring. Egg whites. Dried beans, peas, or lentils. Unsalted nuts or seeds. Unsalted canned beans. Natural peanut or almond butter or other nut butters. Dairy Low-fat dairy products. Skim or low-fat (1%) milk. Reduced fat (2%) and low-sodium cheese. Low-fat ricotta cheese. Low-fat cottage cheese. Plain,  low-fat yogurt. Fats and oils Tub margarine without trans fats. Light or reduced-fat mayonnaise. Light or reduced-fat salad dressings. Avocado. Safflower, olive, sunflower, soybean, and canola oils. The items listed above may not be a complete list of recommended foods and beverages. Talk with your dietitian about what dietary choices are best for you. What foods should I avoid? Fruits Sweetened dried fruit. Canned fruit in syrup. Fruit juice. Vegetables Creamed or fried vegetables. Vegetables in a cheese sauce. Grains White bread. White (regular) pasta. White rice. Cornbread. Bagels. Pastries. Crackers that contain trans fat. Meats and other proteins Fatty cuts of meat. Ribs. Chicken wings. Berniece Salines. Sausage. Bologna. Salami. Chitterlings. Fatback. Hot dogs. Bratwurst. Packaged lunch meats. Dairy Whole or reduced-fat (2%) milk. Half-and-half. Cream cheese. Full-fat or sweetened yogurt. Full-fat cheese. Nondairy creamers. Whipped toppings. Processed cheese or cheese spreads. Cheese curds. Fats and oils Butter. Stick margarine. Lard. Shortening. Ghee. Bacon fat. Tropical oils, such as coconut, palm kernel, or palm oils. Beverages Alcohol. Sweetened drinks, such as soda, lemonade, fruit drinks, or punches. Sweets and desserts Corn syrup. Sugars. Honey. Molasses. Candy. Jam and jelly. Syrup. Sweetened cereals. Cookies. Pies. Cakes. Donuts. Muffins. Ice cream. Condiments Store-bought sauces, dressings, and marinades that are high in sugar, such as ketchup and barbecue sauce. The items listed above may not be a complete list of foods and beverages you should avoid. Talk with your dietitian about what dietary choices are best for you. Summary High levels of triglycerides can increase the risk of heart disease and stroke. Choosing the right foods can help lower your triglycerides. Eat plenty of fresh fruits, vegetables, and whole grains. Choose low-fat dairy and lean meats. Eat fatty fish at least  twice a week. Avoid processed and prepackaged foods with added sugar, sodium, saturated fat, and trans fat. If you need suggestions or have questions about what types of food are good for you, talk with your health care provider or a dietitian. This information is not intended to replace advice given to you by your health care provider. Make sure you discuss any questions you have with your health care provider. Document Revised: 04/02/2021 Document Reviewed: 04/02/2021 Elsevier Patient Education  [place].

## 2021-11-05 NOTE — Progress Notes (Signed)
Subjective:  Patient ID: Ryan Kelly, male    DOB: [birth year]  Age: 50 y.o. MRN: [id number]  CC:  Chief Complaint  Patient presents with   Anxiety    Pt reports doing okay, here for follow up today no questions today due for refills would like you to fill    Palpitations    Pt reports has not had them that he has noticed    Hypothyroidism    Pt due for refill would like this sent in, reports he is running low TSH check ed end of Oct.     HPI   Ryan Kelly presents for  Follow-up from establish care visit October 20.  Generalized anxiety disorder Discussed last visit along with palpitations, atypical chest discomfort.  Mostly during times of anxiety, less likely cardiac cause.  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  Recommended he take BuSpar daily or 5 mg twice daily to suppress anxiety symptoms. Palpitations have improved.Taking Buspar more frequently - daily initially. sometimes forgets more relaxed. No chest pains. Less use past week - off few days. Running low. No side effects with Buspar at this dosing.  Cardiology eval 11/23 - low CVD risk. Atypical pain - MSK source likely. Follow up prn.   Hypothyroidism: Lab Results  Component Value Date   TSH 6.05 (H) 09/30/2021  TSH borderline elevated at his October 27 labs.  At that time he was taking Synthroid 137 mcg daily. Taking medication daily.  No new hot or cold intolerance. No new hair or skin changes, heart palpitations or new fatigue. No new weight changes - overall stable. Would like to remain on same dose meds for now.   Hyperlipidemia: On fenofibrate. Declines statin - would like to try some diet changes  - room for improvement.  Lab Results  Component Value Date   CHOL 214 (H) 09/30/2021   HDL 55.50 09/30/2021   LDLDIRECT 131.0 09/30/2021   TRIG 246.0 (H) 09/30/2021   CHOLHDL 4 09/30/2021   Lab Results  Component Value Date   ALT 34 09/30/2021    AST 25 09/30/2021   ALKPHOS 33 (L) 09/30/2021   BILITOT 0.6 09/30/2021    Declines flu vaccine, colonoscopy scheduled.     History There are no problems to display for this patient.  Past Medical History:  Diagnosis Date   Arthritis    Hyperlipidemia    Thyroid disease    History reviewed. No pertinent surgical history. No Known Allergies Prior to Admission medications   Medication Sig Start Date End Date Taking? Authorizing Provider  busPIRone (BUSPAR) 5 MG tablet Take 1 tablet by mouth daily as needed. 05/06/21  Yes [provider]  fenofibrate micronized (LOFIBRA) 200 MG capsule Take 1 capsule by mouth daily. 02/01/21  Yes [provider]  levothyroxine (SYNTHROID) 137 MCG tablet Take 137 mcg by mouth daily. 11/12/20  Yes [provider]  sildenafil (VIAGRA) 100 MG tablet Take half a tablet (50mg ) one hour prior to sexual activity. 05/06/21  Yes [provider]   Social History   Socioeconomic History   Marital status: Single    Spouse name: Not on file   Number of children: Not on file   Years of education: Not on file   Highest education level: Not on file  Occupational History   Not on file  Tobacco Use   Smoking status: Every Day    Types: Cigarettes   Smokeless tobacco: Never  Substance  and Sexual Activity   Alcohol use: Not on file   Drug use: Yes    Types: Marijuana   Sexual activity: Yes    Birth control/protection: None  Other Topics Concern   Not on file  Social History Narrative   Not on file   Social Determinants of Health   Financial Resource Strain: Not on file  Food Insecurity: Not on file  Transportation Needs: Not on file  Physical Activity: Not on file  Stress: Not on file  Social Connections: Not on file  Intimate Partner Violence: Not on file    Review of Systems  Constitutional:  Negative for fatigue and unexpected weight change.  Eyes:  Negative for visual disturbance.  Respiratory:  Negative  for cough, chest tightness and shortness of breath.   Cardiovascular:  Negative for chest pain, palpitations and leg swelling.  Gastrointestinal:  Negative for abdominal pain and blood in stool.  Neurological:  Negative for dizziness, light-headedness and headaches.    Objective:   Vitals:   11/05/21 1201  BP: 118/60  Pulse: 72  Resp: 17  Temp: 98.1 F (36.7 C)  TempSrc: Temporal  SpO2: 97%  Weight: 234 lb 3.2 oz (106.2 kg)  Height: 5\' 10"  (1.778 m)     Physical Exam Vitals reviewed.  Constitutional:      Appearance: He is well-developed.  HENT:     Head: Normocephalic and atraumatic.  Neck:     Vascular: No carotid bruit or JVD.     Comments: No thyromegaly/nodule/mass.  Cardiovascular:     Rate and Rhythm: Normal rate and regular rhythm.     Heart sounds: Normal heart sounds. No murmur heard. Pulmonary:     Effort: Pulmonary effort is normal.     Breath sounds: Normal breath sounds. No rales.  Musculoskeletal:     Right lower leg: No edema.     Left lower leg: No edema.  Skin:    General: Skin is warm and dry.  Neurological:     Mental Status: He is alert and oriented to person, place, and time.  Psychiatric:        Mood and Affect: Mood normal.       Assessment & Plan:  Ryan Kelly is a 50 y.o. male . Hypothyroidism, unspecified type - Plan: TSH, T4, free, levothyroxine (SYNTHROID) 137 MCG tablet  -Borderline elevated TSH but asymptomatic.  Decided to remain on same dose of Synthroid with a repeat lab in 1 month with free T4.  RTC precautions if new symptoms.  Palpitations  Now resolved, likely anxiety related.  RTC precautions.  Reassuring cardiology eval.  Mixed hyperlipidemia - Plan: fenofibrate micronized (LOFIBRA) 200 MG capsule Hypertriglyceridemia - Plan: fenofibrate micronized (LOFIBRA) 200 MG capsule  -Continue same dose of Lofibra for now.  We did discuss statins but would like to hold on that change at this time.  Some room for dietary  improvement as well as exercise.  Handout given on diet with hypertriglyceridemia, recheck 4 months with fasting labs.  GAD (generalized anxiety disorder) - Plan: busPIRone (BUSPAR) 5 MG tablet  -Improved control, continue BuSpar with daily dosing discussed, twice daily dosing if needed.  Recheck 4 months.  Meds ordered this encounter  Medications   fenofibrate micronized (LOFIBRA) 200 MG capsule    Sig: Take 1 capsule (200 mg total) by mouth daily.    Dispense:  90 capsule    Refill:  1   busPIRone (BUSPAR) 5 MG tablet    Sig: Take 1  tablet (5 mg total) by mouth daily as needed.    Dispense:  90 tablet    Refill:  1   levothyroxine (SYNTHROID) 137 MCG tablet    Sig: Take 1 tablet (137 mcg total) by mouth daily.    Dispense:  90 tablet    Refill:  1   Patient Instructions  Ok to use daily Buspar, and if anxiety increases can take twice per day.   Same dose thyroid med for now - recheck levels at lab only visit in 1 month. Follow up with me in 4 months - fasting labs at that time.   Exercise (start low and go slow) and diet changes may help triglycerides - no med changes for now.   Let me know if any new symptoms. Take care.   High Triglycerides Eating Plan Triglycerides are a type of fat in the blood. High levels of triglycerides can increase your risk of heart disease and stroke. If your triglyceride levels are high, choosing the right foods can help lower your triglycerides and keep your heart healthy. Work with your health care provider or a dietitian to develop an eating plan that is right for you. What are tips for following this plan? General guidelines  Lose weight, if you are overweight. For most people, losing 5-10 lb (2-5 kg) helps lower triglyceride levels. A weight-loss plan may include: 30 minutes of exercise at least 5 days a week. Reducing the amount of calories, sugar, and fat you eat. Eat a wide variety of fresh fruits, vegetables, and whole grains. These foods  are high in fiber. Eat foods that contain healthy fats, such as fatty fish, nuts, seeds, and olive oil. Avoid foods that are high in added sugar, added salt (sodium), and saturated fat. Avoid low-fiber, refined carbohydrates such as white bread, crackers, noodles, and white rice. Avoid foods with trans fats or partially hydrogenated oils, such as fried foods or stick margarine. If you drink alcohol: Limit how much you have to: 0-1 drink a day for women who are not pregnant. 0-2 drinks a day for men. Your health care provider may recommend that you drink less than these amounts depending on your overall health. Know how much alcohol is in a drink. In the [place] one drink equals one 12 oz bottle of beer (355 mL), one 5 oz glass of wine (148 mL), or one 1 oz glass of hard liquor (44 mL). Reading food labels Check food labels for: The amount of saturated fat. Choose foods with no or very little saturated fat (less than 2 g). The amount of trans fat. Choose foods with no transfat. The amount of cholesterol. Choose foods that are low in cholesterol. The amount of sodium. Choose foods with less than 140 milligrams (mg) per serving. Shopping Buy dairy products labeled as nonfat (skim) or low-fat (1%). Avoid buying processed or prepackaged foods. These are often high in added sugar, sodium, and fat. Cooking Choose healthy fats when cooking, such as olive oil, avocado oil, or canola oil. Cook foods using lower fat methods, such as baking, broiling, boiling, or grilling. Make your own sauces, dressings, and marinades when possible, instead of buying them. Store-bought sauces, dressings, and marinades are often high in sodium and sugar. Meal planning Eat more home-cooked food and less restaurant, buffet, and fast food. Eat fatty fish at least 2 times each week. Examples of fatty fish include salmon, trout, sardines, mackerel, tuna, and herring. If you eat whole eggs, do not eat more than  4 egg yolks  per week. What foods should I eat? Fruits All fresh, canned (in natural juice), or frozen fruits. Vegetables Fresh or frozen vegetables. Low-sodium canned vegetables. Grains Whole wheat or whole grain breads, crackers, cereals, and pasta. Unsweetened oatmeal. Bulgur. Barley. Quinoa. Brown rice. Whole wheat flour tortillas. Meats and other proteins Skinless chicken or [place]. Ground chicken or [place]. Lean cuts of pork, trimmed of fat. Fish and seafood, especially salmon, trout, and herring. Egg whites. Dried beans, peas, or lentils. Unsalted nuts or seeds. Unsalted canned beans. Natural peanut or almond butter or other nut butters. Dairy Low-fat dairy products. Skim or low-fat (1%) milk. Reduced fat (2%) and low-sodium cheese. Low-fat ricotta cheese. Low-fat cottage cheese. Plain, low-fat yogurt. Fats and oils Tub margarine without trans fats. Light or reduced-fat mayonnaise. Light or reduced-fat salad dressings. Avocado. Safflower, olive, sunflower, soybean, and canola oils. The items listed above may not be a complete list of recommended foods and beverages. Talk with your dietitian about what dietary choices are best for you. What foods should I avoid? Fruits Sweetened dried fruit. Canned fruit in syrup. Fruit juice. Vegetables Creamed or fried vegetables. Vegetables in a cheese sauce. Grains White bread. White (regular) pasta. White rice. Cornbread. Bagels. Pastries. Crackers that contain trans fat. Meats and other proteins Fatty cuts of meat. Ribs. Chicken wings. Berniece Salines. Sausage. Bologna. Salami. Chitterlings. Fatback. Hot dogs. Bratwurst. Packaged lunch meats. Dairy Whole or reduced-fat (2%) milk. Half-and-half. Cream cheese. Full-fat or sweetened yogurt. Full-fat cheese. Nondairy creamers. Whipped toppings. Processed cheese or cheese spreads. Cheese curds. Fats and oils Butter. Stick margarine. Lard. Shortening. Ghee. Bacon fat. Tropical oils, such as coconut, palm kernel, or palm  oils. Beverages Alcohol. Sweetened drinks, such as soda, lemonade, fruit drinks, or punches. Sweets and desserts Corn syrup. Sugars. Honey. Molasses. Candy. Jam and jelly. Syrup. Sweetened cereals. Cookies. Pies. Cakes. Donuts. Muffins. Ice cream. Condiments Store-bought sauces, dressings, and marinades that are high in sugar, such as ketchup and barbecue sauce. The items listed above may not be a complete list of foods and beverages you should avoid. Talk with your dietitian about what dietary choices are best for you. Summary High levels of triglycerides can increase the risk of heart disease and stroke. Choosing the right foods can help lower your triglycerides. Eat plenty of fresh fruits, vegetables, and whole grains. Choose low-fat dairy and lean meats. Eat fatty fish at least twice a week. Avoid processed and prepackaged foods with added sugar, sodium, saturated fat, and trans fat. If you need suggestions or have questions about what types of food are good for you, talk with your health care provider or a dietitian. This information is not intended to replace advice given to you by your health care provider. Make sure you discuss any questions you have with your health care provider. Document Revised: 04/02/2021 Document Reviewed: 04/02/2021 Elsevier Patient Education  2022 [place],   Merri Ray, MD [facility] 11/05/21 1:07 PM

## 2021-11-23 ENCOUNTER — Other Ambulatory Visit: Payer: Self-pay

## 2021-11-23 ENCOUNTER — Ambulatory Visit (AMBULATORY_SURGERY_CENTER): Payer: No Typology Code available for payment source | Admitting: *Deleted

## 2021-11-23 VITALS — Ht 70.0 in | Wt 225.0 lb

## 2021-11-23 DIAGNOSIS — Z1211 Encounter for screening for malignant neoplasm of colon: Secondary | ICD-10-CM

## 2021-11-23 MED ORDER — NA SULFATE-K SULFATE-MG SULF 17.5-3.13-1.6 GM/177ML PO SOLN: 1.0000 | Freq: Once | ORAL | 0 refills | Status: AC

## 2021-11-23 NOTE — Progress Notes (Signed)
No egg or soy allergy known to patient  No  past sedation  Patient denies past  intubation  No FH of Malignant Hyperthermia Pt is not on diet pills Pt is not on  home 02  Pt is not on blood thinners  Pt denies issues with constipation  No A fib or A flutter  Pt is fully vaccinated  for Covid  and  NO PA's for preps discussed with pt In PV today  Discussed with pt there will be an out-of-pocket cost for prep and that varies from $0 to 70 +  dollars - pt verbalized understanding   Due to the COVID-19 pandemic we are asking patients to follow certain guidelines in PV and the [place]   Pt aware of COVID protocols and [facility] guidelines   PV completed over the phone. Pt verified name, DOB, address and insurance during PV today.  Pt mailed instruction packet with copy of consent form to read and not return, and instructions.   Pt encouraged to call with questions or issues.  If pt has My chart, procedure instructions sent via My Chart   Emialed instructions to [email or web address]

## 2021-12-02 ENCOUNTER — Other Ambulatory Visit: Payer: Self-pay

## 2021-12-02 ENCOUNTER — Ambulatory Visit (AMBULATORY_SURGERY_CENTER): Payer: No Typology Code available for payment source | Admitting: Gastroenterology

## 2021-12-02 ENCOUNTER — Encounter: Payer: Self-pay | Admitting: Gastroenterology

## 2021-12-02 VITALS — BP 127/85 | HR 60 | Temp 98.9°F | Resp 19 | Ht 70.0 in | Wt 234.0 lb

## 2021-12-02 DIAGNOSIS — K635 Polyp of colon: Secondary | ICD-10-CM | POA: Diagnosis not present

## 2021-12-02 DIAGNOSIS — Z1211 Encounter for screening for malignant neoplasm of colon: Secondary | ICD-10-CM

## 2021-12-02 DIAGNOSIS — D122 Benign neoplasm of ascending colon: Secondary | ICD-10-CM

## 2021-12-02 MED ORDER — SODIUM CHLORIDE 0.9 % IV SOLN: 500.0000 mL | INTRAVENOUS | Status: DC

## 2021-12-02 NOTE — Op Note (Signed)
[facility] Patient Name: Ryan Kelly Procedure Date: 12/02/2021 8:54 AM MRN: [id number] Endoscopist: Nicki Reaper E. Candis Schatz , MD Age: 50 Referring MD:  Date of Birth: 1971-06-25 Gender: Male Account #: [id number] Procedure:                Colonoscopy Indications:              Screening for colorectal malignant neoplasm, This                            is the patient's first colonoscopy Medicines:                Monitored Anesthesia Care Procedure:                Pre-Anesthesia Assessment:                           - Prior to the procedure, a History and Physical                            was performed, and patient medications and                            allergies were reviewed. The patient's tolerance of                            previous anesthesia was also reviewed. The risks                            and benefits of the procedure and the sedation                            options and risks were discussed with the patient.                            All questions were answered, and informed consent                            was obtained. Prior Anticoagulants: The patient has                            taken no previous anticoagulant or antiplatelet                            agents. ASA Grade Assessment: II - A patient with                            mild systemic disease. After reviewing the risks                            and benefits, the patient was deemed in                            satisfactory condition to undergo the procedure.  After obtaining informed consent, the colonoscope                            was passed under direct vision. Throughout the                            procedure, the patient's blood pressure, pulse, and                            oxygen saturations were monitored continuously. The                            Olympus Colonoscope #[id number] was introduced through                            the anus and  advanced to the the terminal ileum,                            with identification of the appendiceal orifice and                            IC valve. The colonoscopy was performed without                            difficulty. The patient tolerated the procedure                            well. The quality of the bowel preparation was                            good. The terminal ileum, ileocecal valve,                            appendiceal orifice, and rectum were photographed.                            The bowel preparation used was Suprep via split                            dose instruction. Scope In: 9:04:08 AM Scope Out: 9:17:22 AM Scope Withdrawal Time: 0 hours 10 minutes 36 seconds  Total Procedure Duration: 0 hours 13 minutes 14 seconds  Findings:                 Skin tags were found on perianal exam.                           A 7 mm polyp was found in the ascending colon. The                            polyp was sessile. The polyp was removed with a                            cold snare. Resection and retrieval  were complete.                            Estimated blood loss was minimal.                           The exam was otherwise normal throughout the                            examined colon.                           The terminal ileum appeared normal.                           Non-bleeding internal hemorrhoids were found during                            retroflexion. The hemorrhoids were Grade I                            (internal hemorrhoids that do not prolapse).                           No additional abnormalities were found on                            retroflexion. Complications:            No immediate complications. Estimated Blood Loss:     Estimated blood loss was minimal. Impression:               - Perianal skin tags found on perianal exam.                           - One 7 mm polyp in the ascending colon, removed                            with a cold  snare. Resected and retrieved.                           - The examined portion of the ileum was normal.                           - Non-bleeding internal hemorrhoids. Recommendation:           - Patient has a contact number available for                            emergencies. The signs and symptoms of potential                            delayed complications were discussed with the                            patient. Return to normal activities tomorrow.  Written discharge instructions were provided to the                            patient.                           - Resume previous diet.                           - Continue present medications.                           - Await pathology results.                           - Repeat colonoscopy (date not yet determined) for                            surveillance based on pathology results. Ryan Kelly E. Candis Schatz, MD 12/02/2021 9:22:12 AM This report has been signed electronically.

## 2021-12-02 NOTE — Progress Notes (Signed)
Sedate, gd SR, tolerated procedure well, VSS, report to RN 

## 2021-12-02 NOTE — Progress Notes (Signed)
Pt's states no medical or surgical changes since previsit or office visit. 

## 2021-12-02 NOTE — Progress Notes (Signed)
Called to room to assist during endoscopic procedure.  Patient ID and intended procedure confirmed with present staff. Received instructions for my participation in the procedure from the performing physician.  

## 2021-12-02 NOTE — Progress Notes (Signed)
[facility] History and Physical   Primary Care Physician:  Wendie Agreste, MD   Reason for Procedure:   Colon cancer screening  Plan:    Screening colonoscopy     HPI: Ryan Kelly is a 50 y.o. male undergoing initial average risk screening colonoscopy.  He has no family history of colon cancer (other than grandfather, 66s) and no chronic GI symptoms.    Past Medical History:  Diagnosis Date   Anxiety    Arthritis    Heartburn    past hx   Hyperlipidemia    Thyroid disease     History reviewed. No pertinent surgical history.  Prior to Admission medications   Medication Sig Start Date End Date Taking? Authorizing Provider  busPIRone (BUSPAR) 5 MG tablet Take 1 tablet (5 mg total) by mouth daily as needed. Patient taking differently: Take 5 mg by mouth daily. 11/05/21  Yes Wendie Agreste, MD  fenofibrate micronized (LOFIBRA) 200 MG capsule Take 1 capsule (200 mg total) by mouth daily. 11/05/21  Yes Wendie Agreste, MD  levothyroxine (SYNTHROID) 137 MCG tablet Take 1 tablet (137 mcg total) by mouth daily. 11/05/21  Yes Wendie Agreste, MD  sildenafil (VIAGRA) 100 MG tablet Take half a tablet (50mg ) one hour prior to sexual activity. 05/06/21   [provider]    Current Outpatient Medications  Medication Sig Dispense Refill   busPIRone (BUSPAR) 5 MG tablet Take 1 tablet (5 mg total) by mouth daily as needed. (Patient taking differently: Take 5 mg by mouth daily.) 90 tablet 1   fenofibrate micronized (LOFIBRA) 200 MG capsule Take 1 capsule (200 mg total) by mouth daily. 90 capsule 1   levothyroxine (SYNTHROID) 137 MCG tablet Take 1 tablet (137 mcg total) by mouth daily. 90 tablet 1   sildenafil (VIAGRA) 100 MG tablet Take half a tablet (50mg ) one hour prior to sexual activity.     Current Facility-Administered Medications  Medication Dose Route Frequency Provider Last Rate Last Admin   0.9 %  sodium chloride infusion  500 mL Intravenous  Continuous Daryel November, MD        Allergies as of 12/02/2021   (No Known Allergies)    Family History  Problem Relation Age of Onset   Diabetes Mother    Birth defects Maternal Grandmother    Stomach cancer Maternal Grandfather    Colon cancer Maternal Grandfather    Birth defects Paternal Grandmother    Colon polyps Neg Hx    Esophageal cancer Neg Hx    Rectal cancer Neg Hx     Social History   Socioeconomic History   Marital status: Married    Spouse name: Ryan Kelly   Number of children: Not on file   Years of education: Not on file   Highest education level: Not on file  Occupational History   Not on file  Tobacco Use   Smoking status: Every Day    Packs/day: 1.00    Types: Cigarettes   Smokeless tobacco: Never  Substance and Sexual Activity   Alcohol use: Yes    Comment: daily 6 or so   Drug use: Yes    Types: Marijuana    Comment: little   Sexual activity: Yes    Birth control/protection: None  Other Topics Concern   Not on file  Social History Narrative   Not on file   Social Determinants of Health   Financial Resource Strain: Not on file  Food Insecurity: Not on file  Transportation Needs: Not on file  Physical Activity: Not on file  Stress: Not on file  Social Connections: Not on file  Intimate Partner Violence: Not on file    Review of Systems:  All other review of systems negative except as mentioned in the HPI.  Physical Exam: Vital signs BP (!) 140/92    Pulse 69    Temp 98.9 F (37.2 C) (Temporal)    Ht 5\' 10"  (1.778 m)    Wt 234 lb (106.1 kg)    SpO2 98%    BMI 33.58 kg/m   General:   Alert,  Well-developed, well-nourished, pleasant and cooperative in NAD Airway:  Mallampati 2 Lungs:  Clear throughout to auscultation.   Heart:  Regular rate and rhythm; no murmurs, clicks, rubs,  or gallops. Abdomen:  Soft, nontender and nondistended. Normal bowel sounds.   Neuro/Psych:  Normal mood and affect. A and O x 3   Anwitha Mapes E.  Candis Schatz, MD [facility]

## 2021-12-02 NOTE — Patient Instructions (Signed)
Impression/Recommendations:  Polyp and hemorrhoid handouts given to patient.  Resume previous diet. Continue present medications. Await pathology results.  Repeat colonoscopy for surveillance.  Date to be determined after pathology results reviewd.  YOU HAD AN ENDOSCOPIC PROCEDURE TODAY AT [facility]:   Refer to the procedure report that was given to you for any specific questions about what was found during the examination.  If the procedure report does not answer your questions, please call your gastroenterologist to clarify.  If you requested that your care partner not be given the details of your procedure findings, then the procedure report has been included in a sealed envelope for you to review at your convenience later.  YOU SHOULD EXPECT: Some feelings of bloating in the abdomen. Passage of more gas than usual.  Walking can help get rid of the air that was put into your GI tract during the procedure and reduce the bloating. If you had a lower endoscopy (such as a colonoscopy or flexible sigmoidoscopy) you may notice spotting of blood in your stool or on the toilet paper. If you underwent a bowel prep for your procedure, you may not have a normal bowel movement for a few days.  Please Note:  You might notice some irritation and congestion in your nose or some drainage.  This is from the oxygen used during your procedure.  There is no need for concern and it should clear up in a day or so.  SYMPTOMS TO REPORT IMMEDIATELY:  Following lower endoscopy (colonoscopy or flexible sigmoidoscopy):  Excessive amounts of blood in the stool  Significant tenderness or worsening of abdominal pains  Swelling of the abdomen that is new, acute  Fever of 100F or higher For urgent or emergent issues, a gastroenterologist can be reached at any hour by calling [phone]. Do not use MyChart messaging for urgent concerns.    DIET:  We do recommend a small meal at first, but then  you may proceed to your regular diet.  Drink plenty of fluids but you should avoid alcoholic beverages for 24 hours.  ACTIVITY:  You should plan to take it easy for the rest of today and you should NOT DRIVE or use heavy machinery until tomorrow (because of the sedation medicines used during the test).    FOLLOW UP: Our staff will call the number listed on your records 48-72 hours following your procedure to check on you and address any questions or concerns that you may have regarding the information given to you following your procedure. If we do not reach you, we will leave a message.  We will attempt to reach you two times.  During this call, we will ask if you have developed any symptoms of COVID 19. If you develop any symptoms (ie: fever, flu-like symptoms, shortness of breath, cough etc.) before then, please call [phone].  If you test positive for Covid 19 in the 2 weeks post procedure, please call and report this information to [place].    If any biopsies were taken you will be contacted by phone or by letter within the next 1-3 weeks.  Please call us at [phone] if you have not heard about the biopsies in 3 weeks.    SIGNATURES/CONFIDENTIALITY: You and/or your care partner have signed paperwork which will be entered into your electronic medical record.  These signatures attest to the fact that that the information above on your After Visit Summary has been reviewed and is understood.  Full responsibility  of the confidentiality of this discharge information lies with you and/or your care-partner.

## 2021-12-08 ENCOUNTER — Telehealth: Payer: Self-pay

## 2021-12-08 ENCOUNTER — Other Ambulatory Visit: Payer: No Typology Code available for payment source

## 2021-12-08 ENCOUNTER — Telehealth: Payer: Self-pay | Admitting: *Deleted

## 2021-12-08 NOTE — Telephone Encounter (Signed)
Left message on follow up call. 

## 2021-12-08 NOTE — Telephone Encounter (Signed)
°  Follow up Call-  Call back number 12/02/2021  Post procedure Call Back phone  # [phone]  Permission to leave phone message Yes  Some recent data might be hidden     Patient questions:  Do you have a fever, pain , or abdominal swelling? Yes.   Pain Score  3 *  Have you tolerated food without any problems? Yes.    Have you been able to return to your normal activities? Yes.    Do you have any questions about your discharge instructions: Diet   No. Medications  No. Follow up visit  No.  Do you have questions or concerns about your Care? No.  Actions: * If pain score is 4 or above: No action needed, pain <4.  Have you developed a fever since your procedure? no  2.   Have you had an respiratory symptoms (SOB or cough) since your procedure? no  3.   Have you tested positive for COVID 19 since your procedure no  4.   Have you had any family members/close contacts diagnosed with the COVID 19 since your procedure?  no   If yes to any of these questions please route to Joylene John, RN and Joella Prince, RN

## 2021-12-13 ENCOUNTER — Encounter: Payer: Self-pay | Admitting: Gastroenterology

## 2022-03-07 ENCOUNTER — Ambulatory Visit (INDEPENDENT_AMBULATORY_CARE_PROVIDER_SITE_OTHER): Payer: No Typology Code available for payment source | Admitting: Family Medicine

## 2022-03-07 VITALS — BP 122/70 | HR 65 | Temp 98.0°F | Resp 17 | Ht 70.0 in | Wt 229.4 lb

## 2022-03-07 DIAGNOSIS — E781 Pure hyperglyceridemia: Secondary | ICD-10-CM

## 2022-03-07 DIAGNOSIS — J22 Unspecified acute lower respiratory infection: Secondary | ICD-10-CM

## 2022-03-07 DIAGNOSIS — E039 Hypothyroidism, unspecified: Secondary | ICD-10-CM | POA: Diagnosis not present

## 2022-03-07 DIAGNOSIS — E782 Mixed hyperlipidemia: Secondary | ICD-10-CM | POA: Diagnosis not present

## 2022-03-07 DIAGNOSIS — F411 Generalized anxiety disorder: Secondary | ICD-10-CM | POA: Diagnosis not present

## 2022-03-07 LAB — LIPID PANEL
Cholesterol: 216 mg/dL — ABNORMAL HIGH (ref 0–200)
HDL: 54.2 mg/dL (ref >39.00)
LDL Cholesterol: 127 mg/dL — ABNORMAL HIGH (ref 0–99)
NonHDL: 161.58
Total CHOL/HDL Ratio: 4
Triglycerides: 171 mg/dL — ABNORMAL HIGH (ref 0.0–149.0)
VLDL: 34.2 mg/dL (ref 0.0–40.0)

## 2022-03-07 LAB — COMPREHENSIVE METABOLIC PANEL
ALT: 27 U/L (ref 0–53)
AST: 22 U/L (ref 0–37)
Albumin: 4.6 g/dL (ref 3.5–5.2)
Alkaline Phosphatase: 32 U/L — ABNORMAL LOW (ref 39–117)
BUN: 12 mg/dL (ref 6–23)
CO2: 24 mEq/L (ref 19–32)
Calcium: 9.3 mg/dL (ref 8.4–10.5)
Chloride: 105 mEq/L (ref 96–112)
Creatinine, Ser: 0.9 mg/dL (ref 0.40–1.50)
GFR: 100.36 mL/min (ref >60.00)
Glucose, Bld: 96 mg/dL (ref 70–99)
Potassium: 4.1 mEq/L (ref 3.5–5.1)
Sodium: 139 mEq/L (ref 135–145)
Total Bilirubin: 0.4 mg/dL (ref 0.2–1.2)
Total Protein: 6.9 g/dL (ref 6.0–8.3)

## 2022-03-07 LAB — TSH: TSH: 2.23 u[IU]/mL (ref 0.35–5.50)

## 2022-03-07 MED ORDER — LEVOTHYROXINE SODIUM 137 MCG PO TABS: 137.0000 ug | ORAL_TABLET | Freq: Every day | ORAL | 1 refills | Status: DC

## 2022-03-07 MED ORDER — BUSPIRONE HCL 5 MG PO TABS: 5.0000 mg | ORAL_TABLET | Freq: Every day | ORAL | 1 refills | Status: DC | PRN

## 2022-03-07 MED ORDER — FENOFIBRATE MICRONIZED 200 MG PO CAPS: 200.0000 mg | ORAL_CAPSULE | Freq: Every day | ORAL | 1 refills | Status: DC

## 2022-03-07 MED ORDER — AZITHROMYCIN 250 MG PO TABS: ORAL_TABLET | ORAL | 0 refills | Status: AC

## 2022-03-07 NOTE — Patient Instructions (Addendum)
Try to add some low intensity exercise like walking with ultimate goal of 146mn per week. Even 10-18m can be helpful for weight and cholesterol. If anxiety starts to increase, return to buspar 1-2 times per day.  ?Try mucinex for cough. Zpak for chest infection.  ?Return to the clinic or go to the nearest emergency room if any of your symptoms worsen or new symptoms occur. ?No change in other meds today.  ? ? ?

## 2022-03-07 NOTE — Progress Notes (Signed)
? ?Subjective:  ?Patient ID: Ryan Kelly, male    DOB: [birth year]  Age: 51 y.o. MRN: [id number] ? ?CC:  ?Chief Complaint  ?Patient presents with  ? Hypothyroidism  ?  Pt here for recheck due for labs   ? Hyperlipidemia  ?  Pt is fasting this morning did due for labs   ? Anxiety  ?  Pt doing well does not wish to make any changes   ? ? ?HPI ?Ryan Kelly presents for  ? ?Hypothyroidism: ?Lab Results  ?Component Value Date  ? TSH 6.05 (H) 09/30/2021  ?Taking medication daily.  Synthroid 137 mcg daily. No recent testing - he did not have tsh ordered last visit.  ?Slight cold intolerance. No new hair or skin changes, heart palpitations or new fatigue. No new weight changes. ? ?Hyperlipidemia: ?Treated with fenofibrate.  Statins have been declined.  Planned for some diet changes at his December visit.  Weight is down 5 pounds.  ?Trying to eat less.  ?No regular exercise - trouble with time during day. ?Fasting, no new side effects/myalgias.  ?Wt Readings from Last 3 Encounters:  ?03/07/22 229 lb 6.4 oz (104.1 kg)  ?12/02/21 234 lb (106.1 kg)  ?11/23/21 225 lb (102.1 kg)  ? ? ?Lab Results  ?Component Value Date  ? CHOL 214 (H) 09/30/2021  ? HDL 55.50 09/30/2021  ? LDLDIRECT 131.0 09/30/2021  ? TRIG 246.0 (H) 09/30/2021  ? CHOLHDL 4 09/30/2021  ? ?Lab Results  ?Component Value Date  ? ALT 34 09/30/2021  ? AST 25 09/30/2021  ? ALKPHOS 33 (L) 09/30/2021  ? BILITOT 0.6 09/30/2021  ? ?Generalized anxiety disorder ?With history of palpitations, prior cardiology eval, low CVD risk, atypical pain and likely musculoskeletal source of her previous chest symptoms.  As needed follow-up.  Palpitations improved in December at follow-up.  BuSpar 5 mg daily in past - has been stable off meds past month. Anxiety stable.  ? ?  03/07/2022  ? 10:53 AM 11/05/2021  ? 12:03 PM  ?GAD 7 : Generalized Anxiety Score  ?Nervous, Anxious, on Edge 0 1  ?Control/stop worrying 0 0  ?Worry too much - different things 0 1  ?Trouble relaxing 0 1   ?Restless 0 1  ?Easily annoyed or irritable 1 1  ?Afraid - awful might happen 0 0  ?Total GAD 7 Score 1 5  ? ? ? ?  03/07/2022  ? 10:54 AM 11/05/2021  ? 12:03 PM 09/23/2021  ?  3:21 PM  ?Depression screen PHQ 2/9  ?Decreased Interest 0 0 0  ?Down, Depressed, Hopeless 0 0 0  ?PHQ - 2 Score 0 0 0  ?Altered sleeping  0   ?Tired, decreased energy  1   ?Change in appetite  0   ?Feeling bad or failure about yourself   0   ?Trouble concentrating  0   ?Moving slowly or fidgety/restless  0   ?Suicidal thoughts  0   ?PHQ-9 Score  1   ? ?URI: ?Started 2-3 weeks ago. Outside for awhile in the cold. Sore throat, then into chest cold. Lingered past few weeks. No fever, no dyspnea. Overall better from initial, then persistent - green phlegm. No sinus pain/pressure. Mostly in chest.  ?Tx: otc cold med initially.  ?No sneezing, itchy eyes. No hx of allergic rhinitis.  ? ?History ?There are no problems to display for this patient. ? ?Past Medical History:  ?Diagnosis Date  ? Anxiety   ? Arthritis   ? Heartburn   ?  past hx  ? Hyperlipidemia   ? Thyroid disease   ? ?No past surgical history on file. ?No Known Allergies ?Prior to Admission medications   ?Medication Sig Start Date End Date Taking? Authorizing Provider  ?busPIRone (BUSPAR) 5 MG tablet Take 1 tablet (5 mg total) by mouth daily as needed. ?Patient taking differently: Take 5 mg by mouth daily. 11/05/21  Yes Wendie Agreste, MD  ?fenofibrate micronized (LOFIBRA) 200 MG capsule Take 1 capsule (200 mg total) by mouth daily. 11/05/21  Yes Wendie Agreste, MD  ?levothyroxine (SYNTHROID) 137 MCG tablet Take 1 tablet (137 mcg total) by mouth daily. 11/05/21  Yes Wendie Agreste, MD  ?sildenafil (VIAGRA) 100 MG tablet Take half a tablet ('50mg'$ ) one hour prior to sexual activity. 05/06/21  Yes [provider]  ? ?Social History  ? ?Socioeconomic History  ? Marital status: Married  ?  Spouse name: Melisssa  ? Number of children: Not on file  ? Years of education: Not on file  ?  Highest education level: Not on file  ?Occupational History  ? Not on file  ?Tobacco Use  ? Smoking status: Every Day  ?  Packs/day: 1.00  ?  Types: Cigarettes  ? Smokeless tobacco: Never  ?Substance and Sexual Activity  ? Alcohol use: Yes  ?  Comment: daily 6 or so  ? Drug use: Yes  ?  Types: Marijuana  ?  Comment: little  ? Sexual activity: Yes  ?  Birth control/protection: None  ?Other Topics Concern  ? Not on file  ?Social History Narrative  ? Not on file  ? ?Social Determinants of Health  ? ?Financial Resource Strain: Not on file  ?Food Insecurity: Not on file  ?Transportation Needs: Not on file  ?Physical Activity: Not on file  ?Stress: Not on file  ?Social Connections: Not on file  ?Intimate Partner Violence: Not on file  ? ? ?Review of Systems ?Per HPI ? ?Objective:  ? ?Vitals:  ? 03/07/22 1051  ?BP: 122/70  ?Pulse: 65  ?Resp: 17  ?Temp: 98 ?F (36.7 ?C)  ?TempSrc: Temporal  ?SpO2: 95%  ?Weight: 229 lb 6.4 oz (104.1 kg)  ?Height: '5\' 10"'$  (1.778 m)  ? ? ? ?Physical Exam ?Vitals reviewed.  ?Constitutional:   ?   Appearance: He is well-developed.  ?HENT:  ?   Head: Normocephalic and atraumatic.  ?   Right Ear: Tympanic membrane, ear canal and external ear normal.  ?   Left Ear: Tympanic membrane, ear canal and external ear normal.  ?   Ears:  ?   Comments: Cerumen left canal, visualized TM appears normal, no discharge. ?   Nose: No rhinorrhea.  ?   Mouth/Throat:  ?   Pharynx: No oropharyngeal exudate or posterior oropharyngeal erythema.  ?Eyes:  ?   Conjunctiva/sclera: Conjunctivae normal.  ?   Pupils: Pupils are equal, round, and reactive to light.  ?Neck:  ?   Vascular: No carotid bruit or JVD.  ?   Comments: No apparent nodules or thyroid tenderness. ?Cardiovascular:  ?   Rate and Rhythm: Normal rate and regular rhythm.  ?   Heart sounds: Normal heart sounds. No murmur heard. ?Pulmonary:  ?   Effort: Pulmonary effort is normal.  ?   Breath sounds: Rhonchi (Few scattered rhonchi.  Good air movement throughout.   No distress.) present. No wheezing or rales.  ?Abdominal:  ?   Palpations: Abdomen is soft.  ?   Tenderness: There is no abdominal  tenderness.  ?Musculoskeletal:  ?   Cervical back: Neck supple.  ?   Right lower leg: No edema.  ?   Left lower leg: No edema.  ?Lymphadenopathy:  ?   Cervical: No cervical adenopathy.  ?Skin: ?   General: Skin is warm and dry.  ?   Findings: No rash.  ?Neurological:  ?   Mental Status: He is alert and oriented to person, place, and time.  ?Psychiatric:     ?   Mood and Affect: Mood normal.     ?   Behavior: Behavior normal.  ? ? ? ? ? ?Assessment & Plan:  ?Pranish Akhavan is a 51 y.o. male . ?Hypothyroidism, unspecified type - Plan: levothyroxine (SYNTHROID) 137 MCG tablet, TSH ? -Cold natured as above.  Slight elevated TSH previously, due for repeat testing.  Check today and adjust medications accordingly. ? ?Mixed hyperlipidemia - Plan: fenofibrate micronized (LOFIBRA) 200 MG capsule, Comprehensive metabolic panel, Lipid panel ?Hypertriglyceridemia - Plan: fenofibrate micronized (LOFIBRA) 200 MG capsule, Comprehensive metabolic panel, Lipid panel ? -Commended on weight loss, continue to watch diet, add low intensity exercise.  Check updated lipid panel, continue fenofibrate for now. ? ?GAD (generalized anxiety disorder) - Plan: busPIRone (BUSPAR) 5 MG tablet ? -Well-controlled, has not needed BuSpar recently.  Option to restart if increased anxiety symptoms. ? ?LRTI (lower respiratory tract infection) - Plan: azithromycin (ZITHROMAX) 250 MG tablet ? -Bronchitis versus early CAP.  No fever, O2 sat reassuring, imaging deferred for now.  Diffuse rhonchi.  With persistent discolored phlegm will cover with azithromycin for possible secondary bacterial infection.  Potential side effects and risks of antibiotics discussed, Mucinex as needed for cough with RTC precautions. ? ? ?Meds ordered this encounter  ?Medications  ? levothyroxine (SYNTHROID) 137 MCG tablet  ?  Sig: Take 1 tablet (137  mcg total) by mouth daily.  ?  Dispense:  90 tablet  ?  Refill:  1  ? fenofibrate micronized (LOFIBRA) 200 MG capsule  ?  Sig: Take 1 capsule (200 mg total) by mouth daily.  ?  Dispense:  90 capsule  ?  Re

## 2022-09-07 ENCOUNTER — Ambulatory Visit (INDEPENDENT_AMBULATORY_CARE_PROVIDER_SITE_OTHER): Payer: No Typology Code available for payment source | Admitting: Family Medicine

## 2022-09-07 ENCOUNTER — Other Ambulatory Visit: Payer: No Typology Code available for payment source

## 2022-09-07 ENCOUNTER — Encounter: Payer: Self-pay | Admitting: Family Medicine

## 2022-09-07 VITALS — BP 118/72 | HR 61 | Temp 97.9°F | Ht 70.0 in | Wt 228.6 lb

## 2022-09-07 DIAGNOSIS — E781 Pure hyperglyceridemia: Secondary | ICD-10-CM | POA: Diagnosis not present

## 2022-09-07 DIAGNOSIS — E039 Hypothyroidism, unspecified: Secondary | ICD-10-CM

## 2022-09-07 DIAGNOSIS — M79641 Pain in right hand: Secondary | ICD-10-CM | POA: Diagnosis not present

## 2022-09-07 DIAGNOSIS — E782 Mixed hyperlipidemia: Secondary | ICD-10-CM

## 2022-09-07 DIAGNOSIS — F411 Generalized anxiety disorder: Secondary | ICD-10-CM

## 2022-09-07 MED ORDER — FENOFIBRATE MICRONIZED 200 MG PO CAPS: 200.0000 mg | ORAL_CAPSULE | Freq: Every day | ORAL | 1 refills | Status: DC

## 2022-09-07 MED ORDER — LEVOTHYROXINE SODIUM 137 MCG PO TABS: 137.0000 ug | ORAL_TABLET | Freq: Every day | ORAL | 1 refills | Status: DC

## 2022-09-07 NOTE — Patient Instructions (Addendum)
Lab visit tomorrow.  I will refer you to hand specialist - continue to avoid repetitive use or any specific activities that make that pain worse in the meantime.  No med changes today. Take care!

## 2022-09-07 NOTE — Progress Notes (Signed)
Subjective:  Patient ID: Ryan Kelly, male    DOB: [birth year]  Age: 51 y.o. MRN: [id number]  CC:  Chief Complaint  Patient presents with   Hypothyroidism   Hyperlipidemia   Anxiety    GAD - 1   Right Hand Pain    Pt states he had an xray completed and there is arthritis in his hand and pt states it is a little painful to use     HPI Ryan Kelly presents for   Hypothyroidism: Lab Results  Component Value Date   TSH 2.23 03/07/2022  Taking medication daily.  Synthroid 137 mcg daily Prior cold intolerance.  Better. Stable TSH last visit.  No new hair or skin changes, heart palpitations or new fatigue. No new weight changes.   Right hand pain X-ray noted from May 08, 2021 with degenerative changes at the first interphalangeal joint.  No other degenerative changes identified.  Right first and third MCP stiffness at that time.  Voltaren gel recommended previously, night splints also recommended last year.  Plan for Ortho/hand specialist follow-up once insurance changed.  Seen by Dr. Daron Offer from June 2022.  Notices soreness only if more work with hands. Still in same area at base of thumb. No redness. Feels swollen at times. Has tried voltaren gel - no recent use. Helped prior. Ice at times helps. No limitations, except with playing guitar at times.  No recent locking or triggering. 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 Seems worse past 6 months depending on activity. Has not seen hand specialist.  Tingling into middle finger at times. Hand numbness at times with painting.   Hyperlipidemia: Treated with fenofibrate 200 mg daily.  Statins have been declined.  Weight improving last visit with diet changes.  Down 1 more pound today. Still not interested in statin. No change in exercise.  Dietary indiscretion at the beach.  Not fasting today.  Wt Readings from Last 3 Encounters:  09/07/22 228 lb 9.6 oz (103.7 kg)  03/07/22 229  lb 6.4 oz (104.1 kg)  12/02/21 234 lb (106.1 kg)   Lab Results  Component Value Date   CHOL 216 (H) 03/07/2022   HDL 54.20 03/07/2022   LDLCALC 127 (H) 03/07/2022   LDLDIRECT 131.0 09/30/2021   TRIG 171.0 (H) 03/07/2022   CHOLHDL 4 03/07/2022   Lab Results  Component Value Date   ALT 27 03/07/2022   AST 22 03/07/2022   ALKPHOS 32 (L) 03/07/2022   BILITOT 0.4 03/07/2022   Generalized anxiety disorder With history of palpitations, prior cardiology eval low CVD risk, atypical pain likely musculoskeletal source of prior chest symptoms.  BuSpar 5 mg daily, stable off meds last visit option to restart if increased anxiety symptoms. Doing well - not needing buspar. Has if needed.      09/07/2022   10:46 AM 03/07/2022   10:53 AM 11/05/2021   12:03 PM  GAD 7 : Generalized Anxiety Score  Nervous, Anxious, on Edge 0 0 1  Control/stop worrying 0 0 0  Worry too much - different things 1 0 1  Trouble relaxing 0 0 1  Restless 0 0 1  Easily annoyed or irritable 0 1 1  Afraid - awful might happen 0 0 0  Total GAD 7 Score '1 1 5    '$ History There are no problems to display for this patient.  Past Medical History:  Diagnosis Date   Anxiety    Arthritis  Heartburn    past hx   Hyperlipidemia    Thyroid disease    No past surgical history on file. No Known Allergies Prior to Admission medications   Medication Sig Start Date End Date Taking? Authorizing Provider  busPIRone (BUSPAR) 5 MG tablet Take 1 tablet (5 mg total) by mouth daily as needed. 03/07/22  Yes Wendie Agreste, MD  fenofibrate micronized (LOFIBRA) 200 MG capsule Take 1 capsule (200 mg total) by mouth daily. 03/07/22  Yes Wendie Agreste, MD  levothyroxine (SYNTHROID) 137 MCG tablet Take 1 tablet (137 mcg total) by mouth daily. 03/07/22  Yes Wendie Agreste, MD  sildenafil (VIAGRA) 100 MG tablet Take half a tablet ('50mg'$ ) one hour prior to sexual activity. 05/06/21  Yes [provider]   Social History    Socioeconomic History   Marital status: Married    Spouse name: Academic librarian   Number of children: Not on file   Years of education: Not on file   Highest education level: Not on file  Occupational History   Not on file  Tobacco Use   Smoking status: Every Day    Packs/day: 1.00    Types: Cigarettes   Smokeless tobacco: Never  Substance and Sexual Activity   Alcohol use: Yes    Comment: daily 6 or so   Drug use: Yes    Types: Marijuana    Comment: little   Sexual activity: Yes    Birth control/protection: None  Other Topics Concern   Not on file  Social History Narrative   Not on file   Social Determinants of Health   Financial Resource Strain: Not on file  Food Insecurity: Not on file  Transportation Needs: Not on file  Physical Activity: Not on file  Stress: Not on file  Social Connections: Not on file  Intimate Partner Violence: Not on file    Review of Systems  Constitutional:  Negative for fatigue and unexpected weight change.  Eyes:  Negative for visual disturbance.  Respiratory:  Negative for cough, chest tightness and shortness of breath.   Cardiovascular:  Negative for chest pain, palpitations and leg swelling.  Gastrointestinal:  Negative for abdominal pain and blood in stool.  Neurological:  Negative for dizziness, light-headedness and headaches.     Objective:   Vitals:   09/07/22 1048  BP: 118/72  Pulse: 61  Temp: 97.9 F (36.6 C)  SpO2: 95%  Weight: 228 lb 9.6 oz (103.7 kg)  Height: '5\' 10"'$  (1.778 m)     Physical Exam Vitals reviewed.  Constitutional:      Appearance: He is well-developed.  HENT:     Head: Normocephalic and atraumatic.  Neck:     Vascular: No carotid bruit or JVD.  Cardiovascular:     Rate and Rhythm: Normal rate and regular rhythm.     Heart sounds: Normal heart sounds. No murmur heard. Pulmonary:     Effort: Pulmonary effort is normal.     Breath sounds: Normal breath sounds. No rales.  Musculoskeletal:      Right lower leg: No edema.     Left lower leg: No edema.     Comments: Right hand, no bony tenderness of thumb IP or MCP.  Hand nontender, but describes area of discomfort at the thenar eminence.  Negative Tinel over carpal tunnel, negative Phalen.  No triggering of thumb or fingers noted with full range of motion of hand, phalanges.  No swelling or skin changes.  Skin:  General: Skin is warm and dry.  Neurological:     Mental Status: He is alert and oriented to person, place, and time.  Psychiatric:        Mood and Affect: Mood normal.      Assessment & Plan:  Ryan Kelly is a 51 y.o. male . Right hand pain - Plan: Ambulatory referral to Hand Surgery  -Question component of carpal tunnel syndrome but reassuring exam at present.  Thenar discomfort could be from recurrent median nerve.  Refer to hand specialist for further evaluation.  Activity modification discussed.  Hypothyroidism, unspecified type - Plan: TSH, levothyroxine (SYNTHROID) 137 MCG tablet  -Check labs, continue same dose Synthroid.  Mixed hyperlipidemia - Plan: Comprehensive metabolic panel, Lipid panel, fenofibrate micronized (LOFIBRA) 200 MG capsule  -Tolerating current regimen, check labs.  Continue same dose fenofibrate.  Hypertriglyceridemia - Plan: fenofibrate micronized (LOFIBRA) 200 MG capsule As above  GAD (generalized anxiety disorder)  -Stable off meds with option to restart BuSpar.  Meds ordered this encounter  Medications   levothyroxine (SYNTHROID) 137 MCG tablet    Sig: Take 1 tablet (137 mcg total) by mouth daily.    Dispense:  90 tablet    Refill:  1   fenofibrate micronized (LOFIBRA) 200 MG capsule    Sig: Take 1 capsule (200 mg total) by mouth daily.    Dispense:  90 capsule    Refill:  1   Patient Instructions  Lab visit tomorrow.  I will refer you to hand specialist - continue to avoid repetitive use or any specific activities that make that pain worse in the meantime.  No med  changes today. Take care!     Signed,   Merri Ray, MD [place] 09/07/22 12:19 PM

## 2022-09-08 ENCOUNTER — Other Ambulatory Visit (INDEPENDENT_AMBULATORY_CARE_PROVIDER_SITE_OTHER): Payer: No Typology Code available for payment source

## 2022-09-08 DIAGNOSIS — E782 Mixed hyperlipidemia: Secondary | ICD-10-CM | POA: Diagnosis not present

## 2022-09-08 DIAGNOSIS — E039 Hypothyroidism, unspecified: Secondary | ICD-10-CM

## 2022-09-08 LAB — COMPREHENSIVE METABOLIC PANEL
ALT: 31 U/L (ref 0–53)
AST: 28 U/L (ref 0–37)
Albumin: 4.3 g/dL (ref 3.5–5.2)
Alkaline Phosphatase: 33 U/L — ABNORMAL LOW (ref 39–117)
BUN: 16 mg/dL (ref 6–23)
CO2: 24 mEq/L (ref 19–32)
Calcium: 9 mg/dL (ref 8.4–10.5)
Chloride: 105 mEq/L (ref 96–112)
Creatinine, Ser: 0.94 mg/dL (ref 0.40–1.50)
GFR: 94.92 mL/min (ref >60.00)
Glucose, Bld: 89 mg/dL (ref 70–99)
Potassium: 3.7 mEq/L (ref 3.5–5.1)
Sodium: 137 mEq/L (ref 135–145)
Total Bilirubin: 0.5 mg/dL (ref 0.2–1.2)
Total Protein: 6.9 g/dL (ref 6.0–8.3)

## 2022-09-08 LAB — LIPID PANEL
Cholesterol: 197 mg/dL (ref 0–200)
HDL: 52.5 mg/dL (ref >39.00)
LDL Cholesterol: 107 mg/dL — ABNORMAL HIGH (ref 0–99)
NonHDL: 144.12
Total CHOL/HDL Ratio: 4
Triglycerides: 186 mg/dL — ABNORMAL HIGH (ref 0.0–149.0)
VLDL: 37.2 mg/dL (ref 0.0–40.0)

## 2022-09-08 LAB — TSH: TSH: 3.75 u[IU]/mL (ref 0.35–5.50)

## 2022-09-22 ENCOUNTER — Ambulatory Visit (INDEPENDENT_AMBULATORY_CARE_PROVIDER_SITE_OTHER): Payer: No Typology Code available for payment source

## 2022-09-22 ENCOUNTER — Ambulatory Visit: Payer: Self-pay

## 2022-09-22 ENCOUNTER — Ambulatory Visit (INDEPENDENT_AMBULATORY_CARE_PROVIDER_SITE_OTHER): Payer: No Typology Code available for payment source | Admitting: Orthopaedic Surgery

## 2022-09-22 ENCOUNTER — Encounter: Payer: Self-pay | Admitting: Orthopaedic Surgery

## 2022-09-22 DIAGNOSIS — M1812 Unilateral primary osteoarthritis of first carpometacarpal joint, left hand: Secondary | ICD-10-CM | POA: Diagnosis not present

## 2022-09-22 DIAGNOSIS — M1811 Unilateral primary osteoarthritis of first carpometacarpal joint, right hand: Secondary | ICD-10-CM

## 2022-09-22 MED ORDER — DICLOFENAC SODIUM 75 MG PO TBEC: 75.0000 mg | DELAYED_RELEASE_TABLET | Freq: Two times a day (BID) | ORAL | 2 refills | Status: DC

## 2022-09-22 NOTE — Progress Notes (Signed)
Office Visit Note   Patient: Ryan Kelly           Date of Birth: 1971/10/15           MRN: [id number] Visit Date: 09/22/2022              Requested by: Wendie Agreste, MD [address] [place],  [place] [address] PCP: Wendie Agreste, MD   Assessment & Plan: Visit Diagnoses:  1. Primary osteoarthritis of first carpometacarpal joint of right hand   2. Primary osteoarthritis of first carpometacarpal joint of left hand     Plan: Impression is bilateral thumb CMC arthritis.  Disease process and treatment options were discussed.  He would like to try a cortisone injection in his right thumb CMC joint.  We provided him with braces as well.  Prescription for diclofenac.  Follow-up as needed.  Follow-Up Instructions: No follow-ups on file.   Orders:  Orders Placed This Encounter  Procedures   Hand/UE Inj: R thumb CMC   XR Hand Complete Left   XR Hand Complete Right   Meds ordered this encounter  Medications   diclofenac (VOLTAREN) 75 MG EC tablet    Sig: Take 1 tablet (75 mg total) by mouth 2 (two) times daily.    Dispense:  30 tablet    Refill:  2      Procedures: Hand/UE Inj: R thumb CMC for osteoarthritis on 09/22/2022 10:31 AM Indications: pain Details: 25 G needle Medications: 0.3 mL lidocaine 1 %; 0.33 mL bupivacaine 0.5 %; 13.33 mg methylPREDNISolone acetate 40 MG/ML Outcome: tolerated well, no immediate complications      Clinical Data: No additional findings.   Subjective: Chief Complaint  Patient presents with   Right Hand - Pain   Left Hand - Pain    HPI Ryan Kelly is a very pleasant 51 year old gentleman who comes in for bilateral thumb pain worse with use and activity.  Works a Retail buyer job.  Denies any numbness and tingling.  Review of Systems  Constitutional: Negative.   All other systems reviewed and are negative.    Objective: Vital Signs: There were no vitals taken for this visit.  Physical Exam Vitals and nursing note  reviewed.  Constitutional:      Appearance: He is well-developed.  HENT:     Head: Normocephalic and atraumatic.  Eyes:     Pupils: Pupils are equal, round, and reactive to light.  Pulmonary:     Effort: Pulmonary effort is normal.  Abdominal:     Palpations: Abdomen is soft.  Musculoskeletal:        General: Normal range of motion.     Cervical back: Neck supple.  Skin:    General: Skin is warm.  Neurological:     Mental Status: He is alert and oriented to person, place, and time.  Psychiatric:        Behavior: Behavior normal.        Thought Content: Thought content normal.        Judgment: Judgment normal.     Ortho Exam Examination of bilateral thumbs show mild pain and no crepitus with CMC grind test.  There is no triggering.  Range of motion of the thumb is normal.  Good strength to the thenar muscles. Specialty Comments:  No specialty comments available.  Imaging: No results found.   PMFS History: Patient Active Problem List   Diagnosis Date Noted   Primary osteoarthritis of first carpometacarpal joint of right  hand 09/22/2022   Primary osteoarthritis of first carpometacarpal joint of left hand 09/22/2022   Past Medical History:  Diagnosis Date   Anxiety    Arthritis    Heartburn    past hx   Hyperlipidemia    Thyroid disease     Family History  Problem Relation Age of Onset   Diabetes Mother    Birth defects Maternal Grandmother    Stomach cancer Maternal Grandfather    Colon cancer Maternal Grandfather    Birth defects Paternal Grandmother    Colon polyps Neg Hx    Esophageal cancer Neg Hx    Rectal cancer Neg Hx     History reviewed. No pertinent surgical history. Social History   Occupational History   Not on file  Tobacco Use   Smoking status: Every Day    Packs/day: 1.00    Types: Cigarettes   Smokeless tobacco: Never  Substance and Sexual Activity   Alcohol use: Yes    Comment: daily 6 or so   Drug use: Yes    Types: Marijuana     Comment: little   Sexual activity: Yes    Birth control/protection: None

## 2022-09-23 MED ORDER — LIDOCAINE HCL 1 % IJ SOLN
0.3000 mL | INTRAMUSCULAR | Status: AC | PRN
  Administered 2022-09-22: .3 mL

## 2022-09-23 MED ORDER — BUPIVACAINE HCL 0.5 % IJ SOLN
0.3300 mL | INTRAMUSCULAR | Status: AC | PRN
  Administered 2022-09-22: .33 mL

## 2022-09-23 MED ORDER — METHYLPREDNISOLONE ACETATE 40 MG/ML IJ SUSP
13.3300 mg | INTRAMUSCULAR | Status: AC | PRN
  Administered 2022-09-22: 13.33 mg

## 2022-11-18 IMAGING — CR DG HAND COMPLETE 3+V*R*
3 series · 3 of 3 positions shown · non-contrast
Comparison: None.

CLINICAL DATA: Right first and third MCP stiffness/pain. Evaluate
for arthritis.

EXAM:
RIGHT HAND - COMPLETE 3+ VIEW

[x hand pa right]
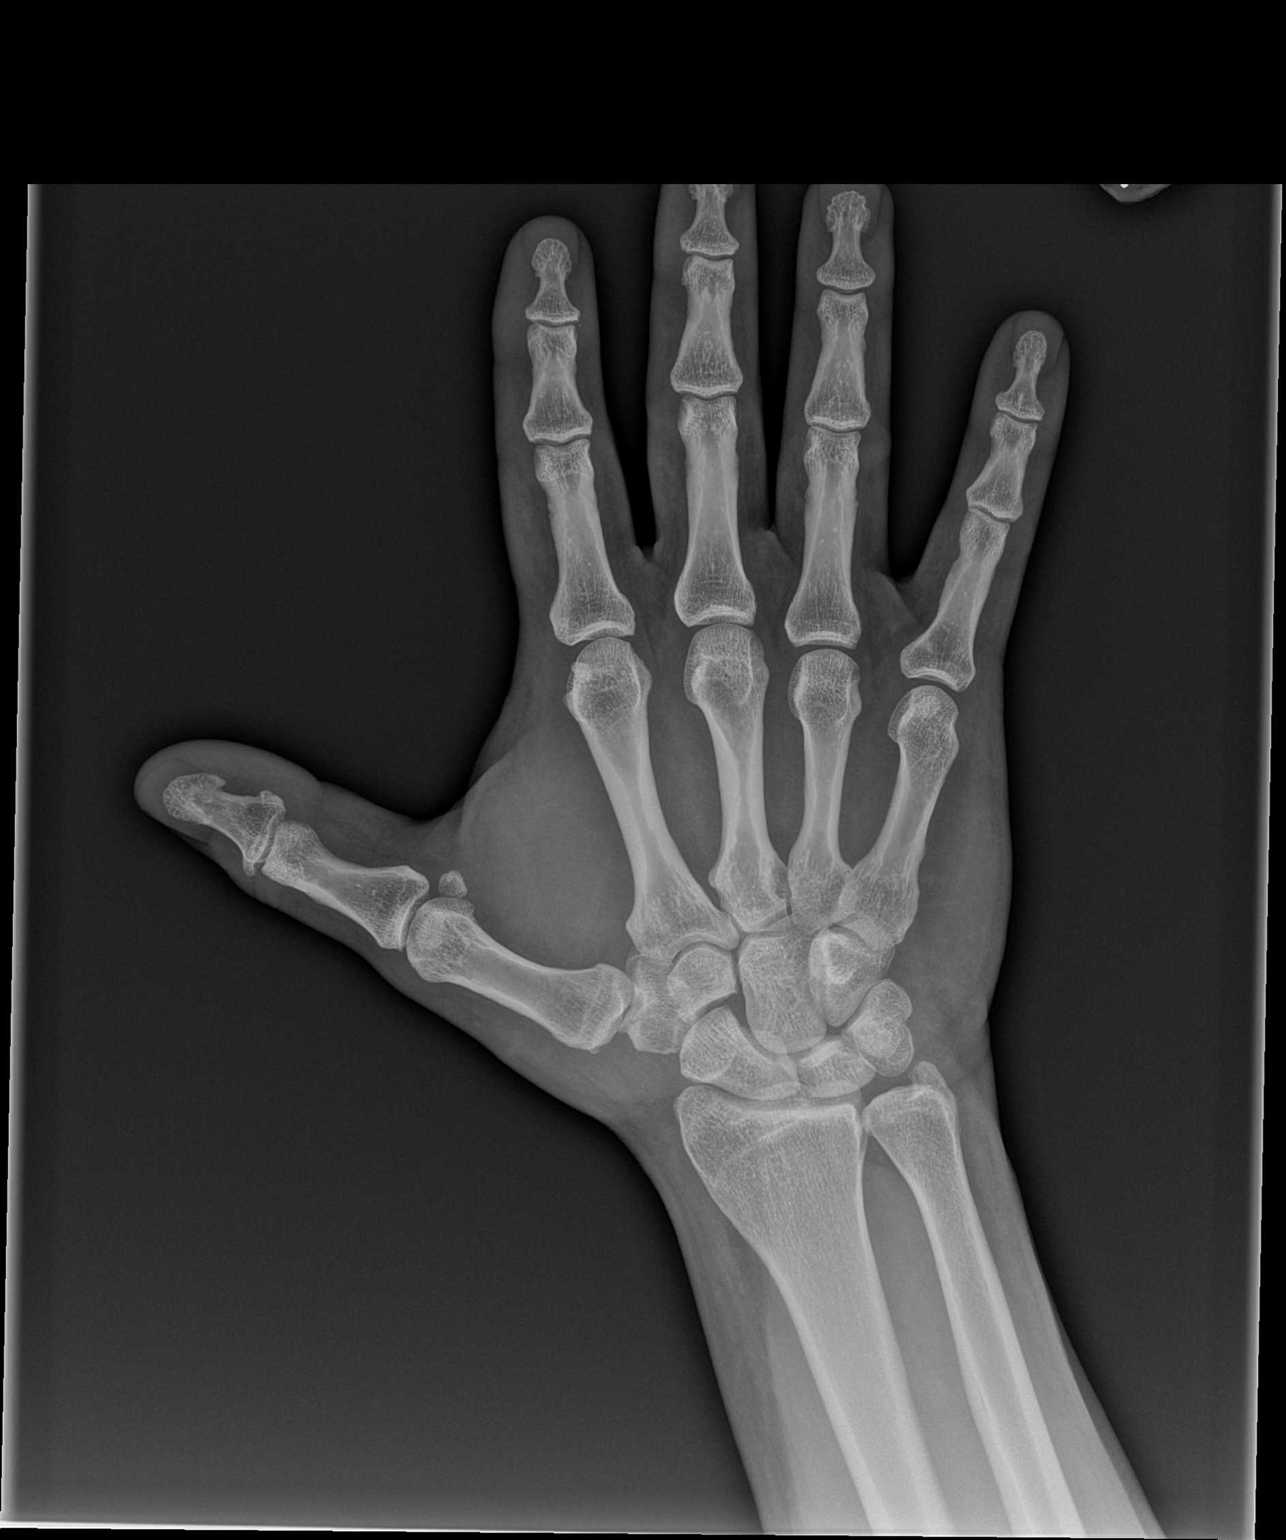

[x hand obl right]
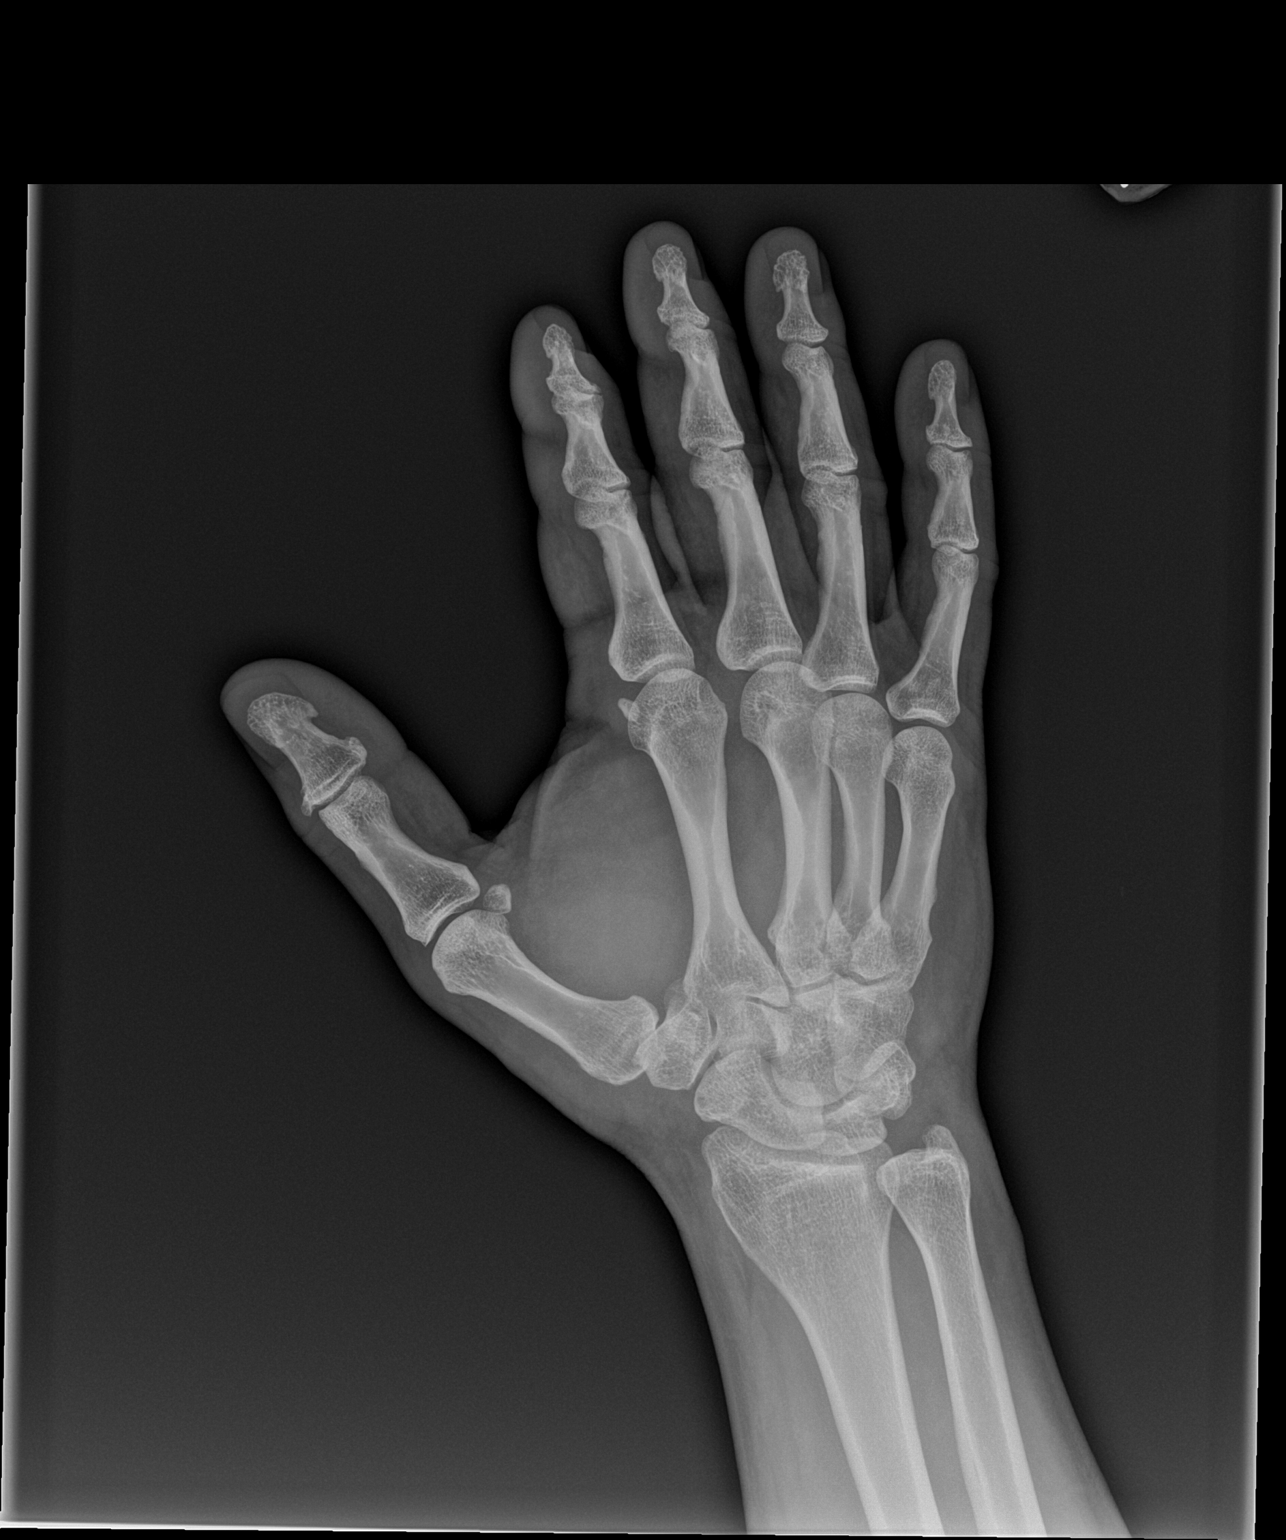

[x hand lat right]
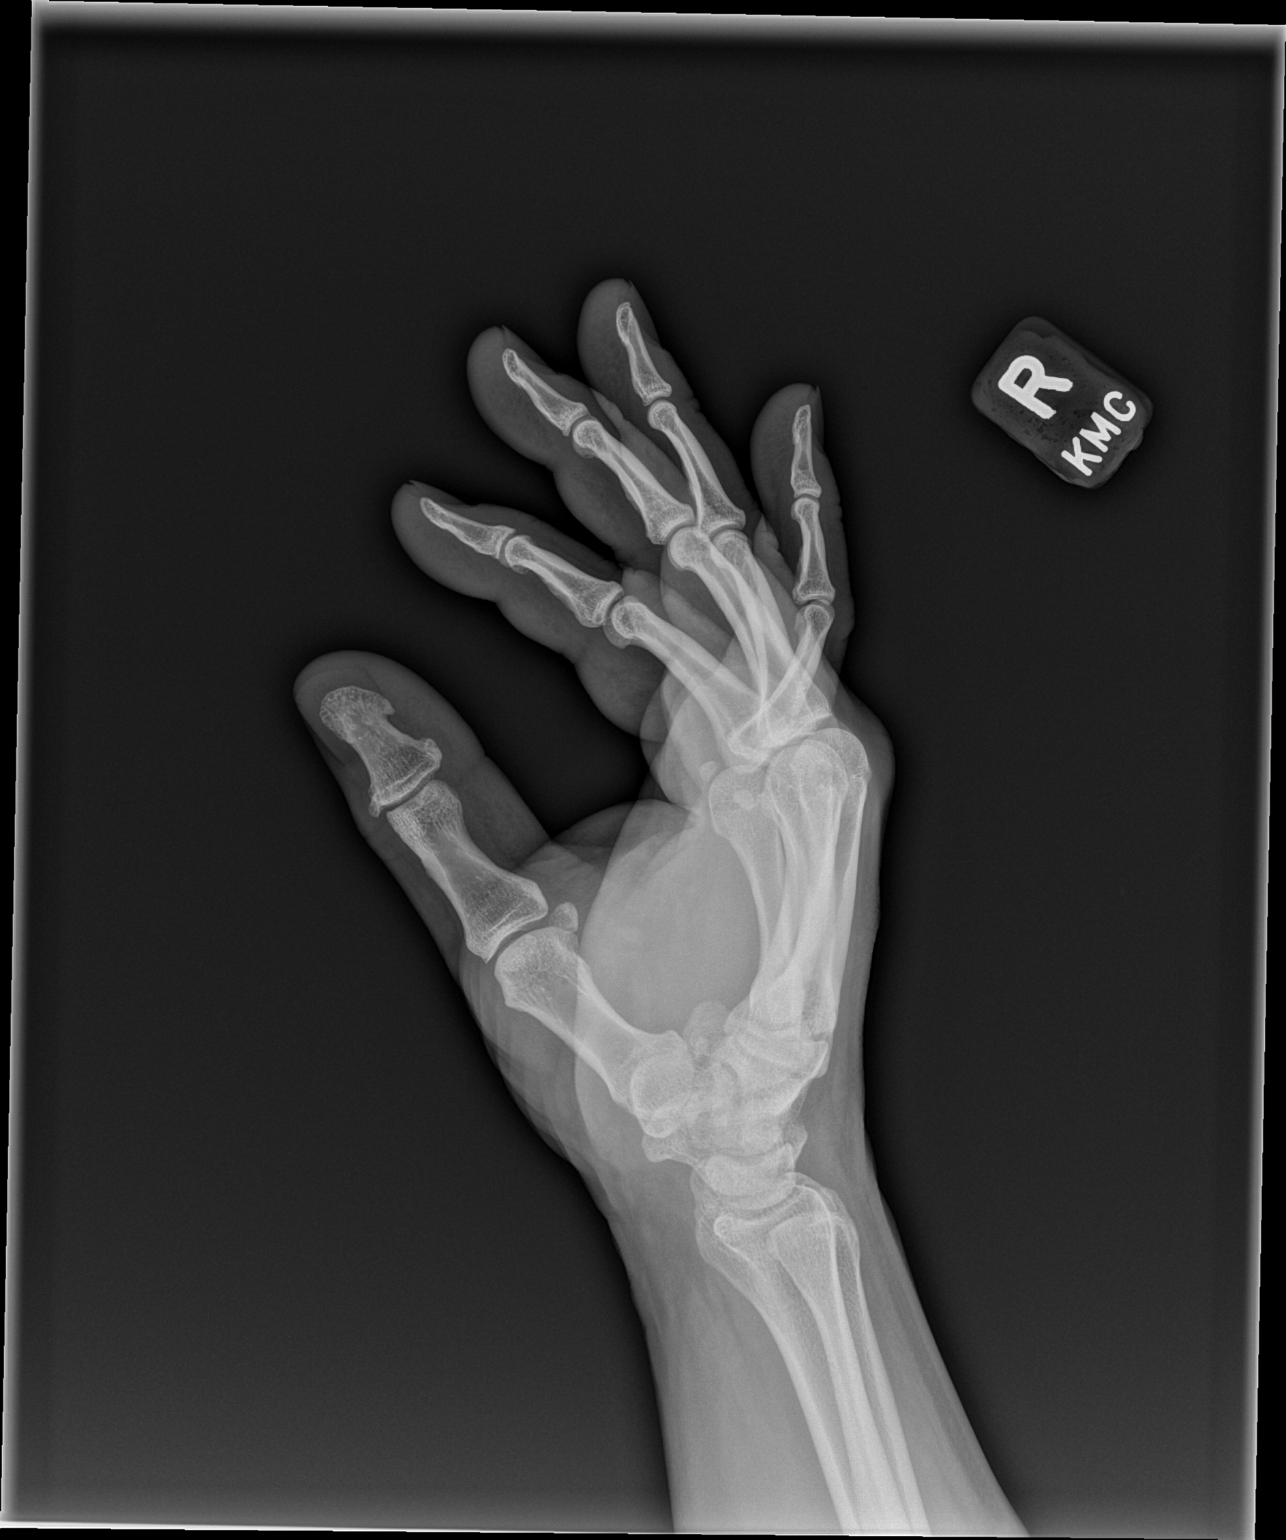

[3 of 3 positions shown; findings below may reference images not displayed]

FINDINGS: Degenerative changes at the first interphalangeal joint. No
significant degenerative changes in the first or third digits. No
erosive changes. No fractures.
IMPRESSION: Degenerative changes at the first interphalangeal joint. No other
degenerative changes identified. No bony erosions.

## 2023-03-09 ENCOUNTER — Ambulatory Visit (INDEPENDENT_AMBULATORY_CARE_PROVIDER_SITE_OTHER): Payer: 59 | Admitting: Family Medicine

## 2023-03-09 ENCOUNTER — Encounter: Payer: Self-pay | Admitting: Family Medicine

## 2023-03-09 VITALS — BP 118/76 | HR 69 | Temp 98.4°F | Ht 70.0 in | Wt 238.0 lb

## 2023-03-09 DIAGNOSIS — E039 Hypothyroidism, unspecified: Secondary | ICD-10-CM | POA: Diagnosis not present

## 2023-03-09 DIAGNOSIS — Z1159 Encounter for screening for other viral diseases: Secondary | ICD-10-CM

## 2023-03-09 DIAGNOSIS — F17219 Nicotine dependence, cigarettes, with unspecified nicotine-induced disorders: Secondary | ICD-10-CM

## 2023-03-09 DIAGNOSIS — E781 Pure hyperglyceridemia: Secondary | ICD-10-CM | POA: Diagnosis not present

## 2023-03-09 DIAGNOSIS — H539 Unspecified visual disturbance: Secondary | ICD-10-CM

## 2023-03-09 DIAGNOSIS — E782 Mixed hyperlipidemia: Secondary | ICD-10-CM | POA: Diagnosis not present

## 2023-03-09 DIAGNOSIS — Z125 Encounter for screening for malignant neoplasm of prostate: Secondary | ICD-10-CM | POA: Diagnosis not present

## 2023-03-09 DIAGNOSIS — Z114 Encounter for screening for human immunodeficiency virus [HIV]: Secondary | ICD-10-CM

## 2023-03-09 DIAGNOSIS — Z0000 Encounter for general adult medical examination without abnormal findings: Secondary | ICD-10-CM

## 2023-03-09 LAB — COMPREHENSIVE METABOLIC PANEL
ALT: 36 U/L (ref 0–53)
AST: 26 U/L (ref 0–37)
Albumin: 5 g/dL (ref 3.5–5.2)
Alkaline Phosphatase: 37 U/L — ABNORMAL LOW (ref 39–117)
BUN: 13 mg/dL (ref 6–23)
CO2: 25 mEq/L (ref 19–32)
Calcium: 9.9 mg/dL (ref 8.4–10.5)
Chloride: 105 mEq/L (ref 96–112)
Creatinine, Ser: 0.97 mg/dL (ref 0.40–1.50)
GFR: 91.09 mL/min (ref >60.00)
Glucose, Bld: 90 mg/dL (ref 70–99)
Potassium: 4.4 mEq/L (ref 3.5–5.1)
Sodium: 138 mEq/L (ref 135–145)
Total Bilirubin: 0.5 mg/dL (ref 0.2–1.2)
Total Protein: 7.6 g/dL (ref 6.0–8.3)

## 2023-03-09 LAB — LDL CHOLESTEROL, DIRECT: Direct LDL: 152 mg/dL

## 2023-03-09 LAB — LIPID PANEL
Cholesterol: 243 mg/dL — ABNORMAL HIGH (ref 0–200)
HDL: 55.1 mg/dL (ref >39.00)
NonHDL: 188.27
Total CHOL/HDL Ratio: 4
Triglycerides: 303 mg/dL — ABNORMAL HIGH (ref 0.0–149.0)
VLDL: 60.6 mg/dL — ABNORMAL HIGH (ref 0.0–40.0)

## 2023-03-09 LAB — TSH: TSH: 3.57 u[IU]/mL (ref 0.35–5.50)

## 2023-03-09 LAB — PSA: PSA: 0.62 ng/mL (ref 0.10–4.00)

## 2023-03-09 NOTE — Progress Notes (Signed)
Subjective:  Patient ID: Ryan Kelly, male    DOB: 04-Feb-1971  Age: 52 y.o. MRN: [id number]  CC:  Chief Complaint  Patient presents with   Annual Exam   Eye Problem    Pt states that he has being seeing what he describes is a black ball randomly appear in his  peripheral view that only last a few seconds in left eye.     HPI Ryan Kelly presents for Annual Exam  And acute concern as above  Left eye visual change Noticed 3-4 weeks ago. 6-7 episodes of random onset of a dark spot - split second of dark orb - temporal visual field left eye. No pain. No amaurosis fugax.  No optho.  No contact lenses or glasses.  No recent eye exam.   Vision Screening   Right eye Left eye Both eyes  Without correction  With correction       Hypothyroidism: Lab Results  Component Value Date   TSH 3.75 09/08/2022  Taking medication daily.  Synthroid 137 mcg QD.  No new hot or cold intolerance. No new hair or skin changes, heart palpitations or new fatigue. No new weight changes. Some weight fluctuations.   Hyperlipidemia: Fenofibrate 200 mg daily, had discussed statins but declined previously.  Prior diet changes with weight loss, slight weight gain today.plans to work on diet.  No new med side effects.  Wt Readings from Last 3 Encounters:  03/09/23 238 lb (108 kg)  09/07/22 228 lb 9.6 oz (103.7 kg)  03/07/22 229 lb 6.4 oz (104.1 kg)   Lab Results  Component Value Date   CHOL 197 09/08/2022   HDL 52.50 09/08/2022   LDLCALC 107 (H) 09/08/2022   LDLDIRECT 131.0 09/30/2021   TRIG 186.0 (H) 09/08/2022   CHOLHDL 4 09/08/2022   Lab Results  Component Value Date   ALT 31 09/08/2022   AST 28 09/08/2022   ALKPHOS 33 (L) 09/08/2022   BILITOT 0.5 09/08/2022   Generalized anxiety disorder With history of palpitations, prior cardiology eval with low CVD risk, atypical pain likely musculoskeletal, BuSpar 5 mg previously, with option to restart if needed had been  doing well off meds at his October visit. Rare need for buspar - few weeks ago. Doing well.     03/09/2023   10:51 AM 09/07/2022   10:46 AM 03/07/2022   10:53 AM 11/05/2021   12:03 PM  GAD 7 : Generalized Anxiety Score  Nervous, Anxious, on Edge 0 0 0 1  Control/stop worrying 0 0 0 0  Worry too much - different things 0 1 0 1  Trouble relaxing 0 0 0 1  Restless 0 0 0 1  Easily annoyed or irritable 0 0 1 1  Afraid - awful might happen 0 0 0 0  Total GAD 7 Score 0 Anxiety Difficulty Not difficult at all         03/09/2023   10:50 AM 09/07/2022   10:45 AM 03/07/2022   10:54 AM 11/05/2021   12:03 PM 09/23/2021    3:21 PM  Depression screen PHQ 2/9  Decreased Interest 0 0 0 0 0  Down, Depressed, Hopeless 0 0 0 0 0  PHQ - 2 Score 0 0 0 0 0  Altered sleeping 1 0  0   Tired, decreased energy 0 1  1   Change in appetite 0 0  0   Feeling bad or failure about yourself  0 0  0   Trouble concentrating 0 0  0   Moving slowly or fidgety/restless 0 0  0   Suicidal thoughts 0 0  0   PHQ-9 Score 1 1  1    Difficult doing work/chores Not difficult at all        Health Maintenance  Topic Date Due   HIV Screening  Never done   Hepatitis C Screening  Never done   DTaP/Tdap/Td (2 - Td or Tdap) 06/21/2021   COVID-19 Vaccine (3 - 2023-24 season) 08/05/2022   Lung Cancer Screening  Never done   Zoster Vaccines- Shingrix (1 of 2) Never done   INFLUENZA VACCINE  07/06/2023   COLONOSCOPY (Pts 45-61yrs Insurance coverage will need to be confirmed)  12/02/2028   HPV VACCINES  Aged Out  Colonoscopy 11/2021 - repeat 7 yrs.  Prostate: does not have family history of prostate cancer The natural history of prostate cancer and ongoing controversy regarding screening and potential treatment outcomes of prostate cancer has been discussed with the patient. The meaning of a false positive PSA and a false negative PSA has been discussed. He indicates understanding of the limitations of this screening test and  wishes  to proceed with screening PSA testing. No results found for: "PSA1", "PSA" HIV/hep C screen today.  Tobacco - still smoking, 1 ppd. Total 54yrs smoking. About a pack per day. Agrees to lung CA screen referral. Not ready to quit.   Immunization History  Administered Date(s) Administered   Tdap 06/22/2011  Updated tetanus today.  Shingrix  - had prior? Other office possibly- will be checking into status.  Covid booster - infection in January. Booster recommended with flu vaccine this fall.   Optho as above.     History Patient Active Problem List   Diagnosis Date Noted   Primary osteoarthritis of first carpometacarpal joint of right hand 09/22/2022   Primary osteoarthritis of first carpometacarpal joint of left hand 09/22/2022   Past Medical History:  Diagnosis Date   Anxiety    Arthritis    Heartburn    past hx   Hyperlipidemia    Thyroid disease    History reviewed. No pertinent surgical history. No Known Allergies Prior to Admission medications   Medication Sig Start Date End Date Taking? Authorizing Provider  busPIRone (BUSPAR) 5 MG tablet Take 1 tablet (5 mg total) by mouth daily as needed. 03/07/22  Yes Shade Flood, MD  busPIRone (BUSPAR) 5 MG tablet 5 mg 2 (two) times daily. 05/06/21  Yes [provider]  diclofenac (VOLTAREN) 75 MG EC tablet Take 1 tablet (75 mg total) by mouth 2 (two) times daily. 09/22/22  Yes Tarry Kos, MD  fenofibrate micronized (LOFIBRA) 200 MG capsule Take 1 capsule (200 mg total) by mouth daily. 09/07/22  Yes Shade Flood, MD  levothyroxine (SYNTHROID) 137 MCG tablet Take 1 tablet (137 mcg total) by mouth daily. 09/07/22  Yes Shade Flood, MD  sildenafil (VIAGRA) 100 MG tablet Take half a tablet (50mg ) one hour prior to sexual activity. 05/06/21  Yes [provider]   Social History   Socioeconomic History   Marital status: Married    Spouse name: Melisssa   Number of children: Not on file   Years of  education: Not on file   Highest education level: Not on file  Occupational History   Not on file  Tobacco Use   Smoking status: Every Day    Packs/day: 1    Types: Cigarettes  Smokeless tobacco: Never  Substance and Sexual Activity   Alcohol use: Yes    Comment: daily 6 or so   Drug use: Yes    Types: Marijuana    Comment: little   Sexual activity: Yes    Birth control/protection: None  Other Topics Concern   Not on file  Social History Narrative   Not on file   Social Determinants of Health   Financial Resource Strain: Not on file  Food Insecurity: Not on file  Transportation Needs: Not on file  Physical Activity: Not on file  Stress: Not on file  Social Connections: Not on file  Intimate Partner Violence: Not on file    Review of Systems 13 point review of systems per patient health survey noted.  Negative other than as indicated above or in HPI.    Objective:   Vitals:   03/09/23 1044  BP: 118/76  Pulse: 69  Temp: 98.4 F (36.9 C)  TempSrc: Temporal  SpO2: 96%  Weight: 238 lb (108 kg)  Height: 5\' 10"  (1.778 m)     Physical Exam Vitals reviewed.  Constitutional:      Appearance: He is well-developed.  HENT:     Head: Normocephalic and atraumatic.     Right Ear: External ear normal.     Left Ear: External ear normal.  Eyes:     Extraocular Movements: Extraocular movements intact.     Conjunctiva/sclera: Conjunctivae normal.     Pupils: Pupils are equal, round, and reactive to light.     Comments: Visual field testing equal and intact bilaterally.  Neck:     Thyroid: No thyromegaly.  Cardiovascular:     Rate and Rhythm: Normal rate and regular rhythm.     Heart sounds: Normal heart sounds.  Pulmonary:     Effort: Pulmonary effort is normal. No respiratory distress.     Breath sounds: Normal breath sounds. No wheezing.  Abdominal:     General: There is no distension.     Palpations: Abdomen is soft.     Tenderness: There is no abdominal  tenderness.  Musculoskeletal:        General: No tenderness. Normal range of motion.     Cervical back: Normal range of motion and neck supple.  Lymphadenopathy:     Cervical: No cervical adenopathy.  Skin:    General: Skin is warm and dry.  Neurological:     Mental Status: He is alert and oriented to person, place, and time.     Deep Tendon Reflexes: Reflexes are normal and symmetric.  Psychiatric:        Behavior: Behavior normal.        Assessment & Plan:  Cass Rill is a 52 y.o. male . Annual physical exam  - -anticipatory guidance as below in AVS, screening labs above. Health maintenance items as above in HPI discussed/recommended as applicable.   Mixed hyperlipidemia - Plan: Lipid panel Hypertriglyceridemia - Plan: Comprehensive metabolic panel  -Tolerating current med regimen with fenofibrate, check updated labs with medication adjustment/plan adjustment accordingly.  Hypothyroidism, unspecified type - Plan: TSH  -Stable with current dosing of Synthroid, continue same, check TSH with plan adjustment accordingly.  Change in vision - Plan: Ambulatory referral to Ophthalmology  -Intermittent darkening of vision, fleeting symptoms.  Not persistent, unlikely retinal detachment, question vitreous detachment.  Reassuring exam at present, refer to ophthalmology with RTC/ER precautions given.  Screening for prostate cancer - Plan: PSA  Cigarette nicotine dependence with nicotine-induced disorder - Plan:  Ambulatory Referral Lung Cancer Screening [facility]  -Cessation discussed, handout on steps to smoking cessation, refer for lung cancer screening.  Screening for HIV (human immunodeficiency virus) - Plan: HIV Antibody (routine testing w rflx)  Need for hepatitis C screening test - Plan: Hepatitis C antibody   No orders of the defined types were placed in this encounter.  Patient Instructions  I will refer you for lung cancer screening. Let me know if you are  ready to quit smoking, and we can discuss ways to help quit.  I will refer you to eye specialist.  No med changes today.  Return to the clinic or go to the nearest emergency room if any of your symptoms worsen or new symptoms occur.  Take care.   Steps to Quit Smoking Smoking tobacco is the leading cause of preventable death. It can affect almost every organ in the body. Smoking puts you and those around you at risk for developing many serious chronic diseases. Quitting smoking can be very challenging. Do not get discouraged if you are not successful the first time. Some people need to make many attempts to quit before they achieve long-term success. Do your best to stick to your quit plan, and talk with your health care provider if you have any questions or concerns. How do I get ready to quit? When you decide to quit smoking, create a plan to help you succeed. Before you quit: Pick a date to quit. Set a date within the next 2 weeks to give you time to prepare. Write down the reasons why you are quitting. Keep this list in places where you will see it often. Tell your family, friends, and co-workers that you are quitting. Support from people you are close to can make quitting easier. Talk with your health care provider about your options for quitting smoking. Find out what treatment options are covered by your health insurance. Identify people, places, things, and activities that make you want to smoke (triggers). Avoid them. What first steps can I take to quit smoking? Throw away all cigarettes at home, at work, and in your car. Throw away smoking accessories, such as Set designer. Clean your car. Make sure to empty the ashtray. Clean your home, including curtains and carpets. What strategies can I use to quit smoking? Talk with your health care provider about combining strategies, such as taking medicines while you are also receiving in-person counseling. Using these two  strategies together makes you more likely to succeed in quitting than if you used either strategy on its own. If you are pregnant or breastfeeding, talk with your health care provider about finding counseling or other support strategies to quit smoking. Do not take medicine to help you quit smoking unless your health care provider tells you to. Quit right away Quit smoking completely, instead of gradually reducing how much you smoke over a period of time. Stopping smoking right away may be more successful than gradually quitting. Attend in-person counseling to help you build problem-solving skills. You are more likely to succeed in quitting if you attend counseling sessions regularly. Even short sessions of 10 minutes can be effective. Take medicine You may take medicines to help you quit smoking. Some medicines require a prescription. You can also purchase over-the-counter medicines. Medicines may have nicotine in them to replace the nicotine in cigarettes. Medicines may: Help to stop cravings. Help to relieve withdrawal symptoms. Your health care provider may recommend: Nicotine patches, gum, or lozenges. Nicotine inhalers  or sprays. Non-nicotine medicine that you take by mouth. Find resources Find resources and support systems that can help you quit smoking and remain smoke-free after you quit. These resources are most helpful when you use them often. They include: Online chats with a Veterinary surgeon. Telephone quitlines. Printed Materials engineer. Support groups or group counseling. Text messaging programs. Mobile phone apps or applications. Use apps that can help you stick to your quit plan by providing reminders, tips, and encouragement. Examples of free services include Quit Guide from the CDC and smokefree.gov  What can I do to make it easier to quit?  Reach out to your family and friends for support and encouragement. Call telephone quitlines, such as 1-800-QUIT-NOW, reach out to support  groups, or work with a counselor for support. Ask people who smoke to avoid smoking around you. Avoid places that trigger you to smoke, such as bars, parties, or smoke-break areas at work. Spend time with people who do not smoke. Lessen the stress in your life. Stress can be a smoking trigger for some people. To lessen stress, try: Exercising regularly. Doing deep-breathing exercises. Doing yoga. Meditating. What benefits will I see if I quit smoking? Over time, you should start to see positive results, such as: Improved sense of smell and taste. Decreased coughing and sore throat. Slower heart rate. Lower blood pressure. Clearer and healthier skin. The ability to breathe more easily. Fewer sick days. Summary Quitting smoking can be very challenging. Do not get discouraged if you are not successful the first time. Some people need to make many attempts to quit before they achieve long-term success. When you decide to quit smoking, create a plan to help you succeed. Quit smoking right away, not slowly over a period of time. Find resources and support systems that can help you quit smoking and remain smoke-free after you quit. This information is not intended to replace advice given to you by your health care provider. Make sure you discuss any questions you have with your health care provider. Document Revised: 11/12/2021 Document Reviewed: 11/12/2021 Elsevier Patient Education  2023 Elsevier Inc.     Signed,   Meredith Staggers, MD [facility], [facility] 03/09/23 11:11 AM

## 2023-03-09 NOTE — Patient Instructions (Addendum)
I will refer you for lung cancer screening. Let me know if you are ready to quit smoking, and we can discuss ways to help quit.  I will refer you to eye specialist.  No med changes today.  Return to the clinic or go to the nearest emergency room if any of your symptoms worsen or new symptoms occur.  Take care.   Steps to Quit Smoking Smoking tobacco is the leading cause of preventable death. It can affect almost every organ in the body. Smoking puts you and those around you at risk for developing many serious chronic diseases. Quitting smoking can be very challenging. Do not get discouraged if you are not successful the first time. Some people need to make many attempts to quit before they achieve long-term success. Do your best to stick to your quit plan, and talk with your health care provider if you have any questions or concerns. How do I get ready to quit? When you decide to quit smoking, create a plan to help you succeed. Before you quit: Pick a date to quit. Set a date within the next 2 weeks to give you time to prepare. Write down the reasons why you are quitting. Keep this list in places where you will see it often. Tell your family, friends, and co-workers that you are quitting. Support from people you are close to can make quitting easier. Talk with your health care provider about your options for quitting smoking. Find out what treatment options are covered by your health insurance. Identify people, places, things, and activities that make you want to smoke (triggers). Avoid them. What first steps can I take to quit smoking? Throw away all cigarettes at home, at work, and in your car. Throw away smoking accessories, such as Scientist, research (medical). Clean your car. Make sure to empty the ashtray. Clean your home, including curtains and carpets. What strategies can I use to quit smoking? Talk with your health care provider about combining strategies, such as taking medicines while you  are also receiving in-person counseling. Using these two strategies together makes you more likely to succeed in quitting than if you used either strategy on its own. If you are pregnant or breastfeeding, talk with your health care provider about finding counseling or other support strategies to quit smoking. Do not take medicine to help you quit smoking unless your health care provider tells you to. Quit right away Quit smoking completely, instead of gradually reducing how much you smoke over a period of time. Stopping smoking right away may be more successful than gradually quitting. Attend in-person counseling to help you build problem-solving skills. You are more likely to succeed in quitting if you attend counseling sessions regularly. Even short sessions of 10 minutes can be effective. Take medicine You may take medicines to help you quit smoking. Some medicines require a prescription. You can also purchase over-the-counter medicines. Medicines may have nicotine in them to replace the nicotine in cigarettes. Medicines may: Help to stop cravings. Help to relieve withdrawal symptoms. Your health care provider may recommend: Nicotine patches, gum, or lozenges. Nicotine inhalers or sprays. Non-nicotine medicine that you take by mouth. Find resources Find resources and support systems that can help you quit smoking and remain smoke-free after you quit. These resources are most helpful when you use them often. They include: Online chats with a Social worker. Telephone quitlines. Printed Furniture conservator/restorer. Support groups or group counseling. Text messaging programs. Mobile phone apps or applications. Use apps that  can help you stick to your quit plan by providing reminders, tips, and encouragement. Examples of free services include Quit Guide from the CDC and smokefree.gov  What can I do to make it easier to quit?  Reach out to your family and friends for support and encouragement. Call  telephone quitlines, such as 1-800-QUIT-NOW, reach out to support groups, or work with a counselor for support. Ask people who smoke to avoid smoking around you. Avoid places that trigger you to smoke, such as bars, parties, or smoke-break areas at work. Spend time with people who do not smoke. Lessen the stress in your life. Stress can be a smoking trigger for some people. To lessen stress, try: Exercising regularly. Doing deep-breathing exercises. Doing yoga. Meditating. What benefits will I see if I quit smoking? Over time, you should start to see positive results, such as: Improved sense of smell and taste. Decreased coughing and sore throat. Slower heart rate. Lower blood pressure. Clearer and healthier skin. The ability to breathe more easily. Fewer sick days. Summary Quitting smoking can be very challenging. Do not get discouraged if you are not successful the first time. Some people need to make many attempts to quit before they achieve long-term success. When you decide to quit smoking, create a plan to help you succeed. Quit smoking right away, not slowly over a period of time. Find resources and support systems that can help you quit smoking and remain smoke-free after you quit. This information is not intended to replace advice given to you by your health care provider. Make sure you discuss any questions you have with your health care provider. Document Revised: 11/12/2021 Document Reviewed: 11/12/2021 Elsevier Patient Education  [place].

## 2023-03-10 LAB — HIV ANTIBODY (ROUTINE TESTING W REFLEX): HIV 1&2 Ab, 4th Generation: NONREACTIVE

## 2023-03-10 LAB — HEPATITIS C ANTIBODY: Hepatitis C Ab: NONREACTIVE

## 2023-03-20 ENCOUNTER — Telehealth: Payer: Self-pay

## 2023-03-20 NOTE — Telephone Encounter (Signed)
-----   Message from Shade Flood, MD sent at 03/19/2023  9:49 AM EDT ----- Lab letter  Cholesterol elevated, higher than 6 months ago.  Electrolytes, kidney, liver tests overall stable.  Thyroid test, prostate test were normal.  HIV and hepatitis C test were both nonreactive or normal.  No new meds for now, but lets see what the improved diet does for cholesterol levels in the next 3 to 6 months.  Let me know if you have questions in the meantime.  Dr. Neva Seat

## 2023-03-20 NOTE — Telephone Encounter (Signed)
Pt aware of lab results 

## 2023-03-21 ENCOUNTER — Other Ambulatory Visit: Payer: Self-pay | Admitting: Family Medicine

## 2023-03-21 DIAGNOSIS — E782 Mixed hyperlipidemia: Secondary | ICD-10-CM

## 2023-03-21 DIAGNOSIS — E781 Pure hyperglyceridemia: Secondary | ICD-10-CM

## 2023-05-01 ENCOUNTER — Other Ambulatory Visit: Payer: Self-pay | Admitting: Family Medicine

## 2023-05-01 DIAGNOSIS — E039 Hypothyroidism, unspecified: Secondary | ICD-10-CM

## 2023-06-22 ENCOUNTER — Other Ambulatory Visit: Payer: Self-pay | Admitting: Family Medicine

## 2023-06-22 DIAGNOSIS — E782 Mixed hyperlipidemia: Secondary | ICD-10-CM

## 2023-06-22 DIAGNOSIS — E781 Pure hyperglyceridemia: Secondary | ICD-10-CM

## 2023-06-23 ENCOUNTER — Encounter: Payer: Self-pay | Admitting: *Deleted

## 2023-07-28 ENCOUNTER — Other Ambulatory Visit: Payer: Self-pay | Admitting: Family Medicine

## 2023-07-28 DIAGNOSIS — E039 Hypothyroidism, unspecified: Secondary | ICD-10-CM

## 2023-09-08 ENCOUNTER — Ambulatory Visit: Payer: 59 | Admitting: Family Medicine

## 2023-09-08 VITALS — BP 116/74 | HR 63 | Temp 97.8°F | Wt 226.2 lb

## 2023-09-08 DIAGNOSIS — E782 Mixed hyperlipidemia: Secondary | ICD-10-CM | POA: Diagnosis not present

## 2023-09-08 DIAGNOSIS — M545 Low back pain, unspecified: Secondary | ICD-10-CM

## 2023-09-08 DIAGNOSIS — E781 Pure hyperglyceridemia: Secondary | ICD-10-CM | POA: Diagnosis not present

## 2023-09-08 DIAGNOSIS — N529 Male erectile dysfunction, unspecified: Secondary | ICD-10-CM

## 2023-09-08 DIAGNOSIS — M65331 Trigger finger, right middle finger: Secondary | ICD-10-CM

## 2023-09-08 DIAGNOSIS — E039 Hypothyroidism, unspecified: Secondary | ICD-10-CM | POA: Diagnosis not present

## 2023-09-08 DIAGNOSIS — F411 Generalized anxiety disorder: Secondary | ICD-10-CM

## 2023-09-08 DIAGNOSIS — M79644 Pain in right finger(s): Secondary | ICD-10-CM | POA: Diagnosis not present

## 2023-09-08 LAB — COMPREHENSIVE METABOLIC PANEL
ALT: 34 U/L (ref 0–53)
AST: 25 U/L (ref 0–37)
Albumin: 4.6 g/dL (ref 3.5–5.2)
Alkaline Phosphatase: 33 U/L — ABNORMAL LOW (ref 39–117)
BUN: 18 mg/dL (ref 6–23)
CO2: 25 meq/L (ref 19–32)
Calcium: 9.5 mg/dL (ref 8.4–10.5)
Chloride: 104 meq/L (ref 96–112)
Creatinine, Ser: 0.97 mg/dL (ref 0.40–1.50)
GFR: 90.77 mL/min (ref >60.00)
Glucose, Bld: 97 mg/dL (ref 70–99)
Potassium: 3.8 meq/L (ref 3.5–5.1)
Sodium: 137 meq/L (ref 135–145)
Total Bilirubin: 0.6 mg/dL (ref 0.2–1.2)
Total Protein: 7.5 g/dL (ref 6.0–8.3)

## 2023-09-08 LAB — LIPID PANEL
Cholesterol: 225 mg/dL — ABNORMAL HIGH (ref 0–200)
HDL: 58.6 mg/dL (ref >39.00)
LDL Cholesterol: 124 mg/dL — ABNORMAL HIGH (ref 0–99)
NonHDL: 165.92
Total CHOL/HDL Ratio: 4
Triglycerides: 208 mg/dL — ABNORMAL HIGH (ref 0.0–149.0)
VLDL: 41.6 mg/dL — ABNORMAL HIGH (ref 0.0–40.0)

## 2023-09-08 LAB — TSH: TSH: 4.8 u[IU]/mL (ref 0.35–5.50)

## 2023-09-08 MED ORDER — BUSPIRONE HCL 5 MG PO TABS
5.0000 mg | ORAL_TABLET | Freq: Every day | ORAL | 1 refills | Status: AC | PRN
Start: 2023-09-08 — End: ?

## 2023-09-08 MED ORDER — SILDENAFIL CITRATE 100 MG PO TABS: 50.0000 mg | ORAL_TABLET | Freq: Every day | ORAL | 5 refills | Status: AC | PRN

## 2023-09-08 MED ORDER — LEVOTHYROXINE SODIUM 137 MCG PO TABS
137.0000 ug | ORAL_TABLET | Freq: Every day | ORAL | 0 refills | Status: DC
Start: 2023-09-08 — End: 2024-02-02

## 2023-09-08 MED ORDER — FENOFIBRATE MICRONIZED 200 MG PO CAPS
200.0000 mg | ORAL_CAPSULE | Freq: Every day | ORAL | 0 refills | Status: DC
Start: 2023-09-08 — End: 2023-12-12

## 2023-09-08 NOTE — Progress Notes (Unsigned)
Subjective:  Patient ID: Ryan Kelly, male    DOB: 08/21/1971  Age: 52 y.o. MRN: [id number]  CC:  Chief Complaint  Patient presents with  . Medical Management of Chronic Issues    HPI [name] presents for  Follow-up, last visit for physical in April.  Hypothyroidism: Lab Results  Component Value Date   TSH 3.57 03/09/2023  Synthroid 137 mcg daily Taking medication daily.  No new hot or cold intolerance. No new hair or skin changes, heart palpitations or new fatigue. No new weight changes.    Hyperlipidemia: Fenofibrate 200 mg daily, statins have been discussed previously but declined.  Also has tried to work on dietary changes for weight loss.  Weight has decreased by 12 pounds since April. No new med side effects. Wt Readings from Last 3 Encounters:  09/08/23 226 lb 3.2 oz (102.6 kg)  03/09/23 238 lb (108 kg)  09/07/22 228 lb 9.6 oz (103.7 kg)    Lab Results  Component Value Date   CHOL 243 (H) 03/09/2023   HDL 55.10 03/09/2023   LDLCALC 107 (H) 09/08/2022   LDLDIRECT 152.0 03/09/2023   TRIG 303.0 (H) 03/09/2023   CHOLHDL 4 03/09/2023   Lab Results  Component Value Date   ALT 36 03/09/2023   AST 26 03/09/2023   ALKPHOS 37 (L) 03/09/2023   BILITOT 0.5 03/09/2023   Generalized anxiety disorder Rare need for buspirone when last discussed in April.     09/08/2023   10:04 AM 03/09/2023   10:51 AM 09/07/2022   10:46 AM 03/07/2022   10:53 AM  GAD 7 : Generalized Anxiety Score  Nervous, Anxious, on Edge 0 0 0 0  Control/stop worrying 0 0 0 0  Worry too much - different things 0 0 1 0  Trouble relaxing 0 0 0 0  Restless 0 0 0 0  Easily annoyed or irritable 0 0 0 1  Afraid - awful might happen 0 0 0 0  Total GAD 7 Score 0 0 1 1  Anxiety Difficulty  Not difficult at all     Health maintenance, declines flu vaccine. Tdap - possibly at other office.  COVID booster declines.  Shingles vaccine - had at other office.   Low back pain Past few weeks,  no known injury, had been doing some moving.  No fevers, night sweats, weight loss, bowel or bladder incontinence or leg radiation, no leg weakness.  Pain in the right low back, towards the buttocks at times.  In the past some stretches and symptomatic care of help.  Has been using Tylenol, Naprosyn with some improvement.  Right middle finger pain, swelling Also noted past few weeks at the DIP joint of his right middle finger.  No known injury, some increased moving as above, swelling and numbness in that area initially but those have improved over the past few days.  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  Right middle finger locking Intermittent symptoms over the past year, notices when he flexes his finger then extends, sometimes pops.  No persistent symptoms and not bothering him currently.   History Patient Active Problem List   Diagnosis Date Noted  . Primary osteoarthritis of first carpometacarpal joint of right hand 09/22/2022  . Primary osteoarthritis of first carpometacarpal joint of left hand 09/22/2022   Past Medical History:  Diagnosis Date  . Anxiety   . Arthritis   . Heartburn    past hx  . Hyperlipidemia   . Thyroid  disease    No past surgical history on file. No Known Allergies Prior to Admission medications   Medication Sig Start Date End Date Taking? Authorizing Provider  busPIRone (BUSPAR) 5 MG tablet Take 1 tablet (5 mg total) by mouth daily as needed. 03/07/22  Yes Shade Flood, MD  diclofenac (VOLTAREN) 75 MG EC tablet Take 1 tablet (75 mg total) by mouth 2 (two) times daily. 09/22/22  Yes Tarry Kos, MD  fenofibrate micronized (LOFIBRA) 200 MG capsule Take 1 capsule by mouth once daily 06/22/23  Yes Shade Flood, MD  levothyroxine (SYNTHROID) 137 MCG tablet Take 1 tablet by mouth once daily 07/28/23  Yes Shade Flood, MD  sildenafil (VIAGRA) 100 MG tablet Take half a tablet (50mg ) one hour prior to sexual activity. 05/06/21  Yes  [provider]  busPIRone (BUSPAR) 5 MG tablet 5 mg 2 (two) times daily. 05/06/21   [provider]   Social History   Socioeconomic History  . Marital status: Married    Spouse name: Melisssa  . Number of children: Not on file  . Years of education: Not on file  . Highest education level: Not on file  Occupational History  . Not on file  Tobacco Use  . Smoking status: Every Day    Current packs/day: 1.00    Types: Cigarettes  . Smokeless tobacco: Never  Substance and Sexual Activity  . Alcohol use: Yes    Comment: daily 6 or so  . Drug use: Yes    Types: Marijuana    Comment: little  . Sexual activity: Yes    Birth control/protection: None  Other Topics Concern  . Not on file  Social History Narrative  . Not on file   Social Determinants of Health   Financial Resource Strain: Not on file  Food Insecurity: Not on file  Transportation Needs: Not on file  Physical Activity: Not on file  Stress: Not on file  Social Connections: Not on file  Intimate Partner Violence: Not on file    Review of Systems  Constitutional:  Negative for fatigue and unexpected weight change.  Eyes:  Negative for visual disturbance.  Respiratory:  Negative for cough, chest tightness and shortness of breath.   Cardiovascular:  Negative for chest pain, palpitations and leg swelling.  Gastrointestinal:  Negative for abdominal pain and blood in stool.  Neurological:  Negative for dizziness, light-headedness and headaches.     Objective:   Vitals:   09/08/23 0950  BP: 116/74  Pulse: 63  Temp: 97.8 F (36.6 C)  TempSrc: Temporal  SpO2: 97%  Weight: 226 lb 3.2 oz (102.6 kg)     Physical Exam Vitals reviewed.  Constitutional:      Appearance: He is well-developed.  HENT:     Head: Normocephalic and atraumatic.  Neck:     Vascular: No carotid bruit or JVD.  Cardiovascular:     Rate and Rhythm: Normal rate and regular rhythm.     Heart sounds: Normal heart  sounds. No murmur heard. Pulmonary:     Effort: Pulmonary effort is normal.     Breath sounds: Normal breath sounds. No rales.  Musculoskeletal:     Right lower leg: No edema.     Left lower leg: No edema.     Comments: Minimal discomfort, soft tissue swelling of the DIP right middle finger.  Intact motion.  Able to resist flexion/extension.  Describes previous area discomfort in the palmar aspect of third MCP, no  triggering appreciated on exam currently.  Full range of motion of MCP.  Lumbar spine, no midline bony tenderness, minimal discomfort of the right paraspinals.  Intact range of motion, negative seated straight leg raise, able to heel and toe walk without difficulty.  Skin:    General: Skin is warm and dry.  Neurological:     Mental Status: He is alert and oriented to person, place, and time.  Psychiatric:        Mood and Affect: Mood normal.        Assessment & Plan:  Javaughn Opdahl is a 52 y.o. male . Acute right-sided low back pain without sciatica  GAD (generalized anxiety disorder) - Plan: busPIRone (BUSPAR) 5 MG tablet  Hypothyroidism, unspecified type - Plan: levothyroxine (SYNTHROID) 137 MCG tablet, TSH  Mixed hyperlipidemia - Plan: CMP, Lipid panel, fenofibrate micronized (LOFIBRA) 200 MG capsule  Hypertriglyceridemia - Plan: CMP, Lipid panel, fenofibrate micronized (LOFIBRA) 200 MG capsule  Pain of right middle finger  Trigger middle finger of right hand  Erectile dysfunction, unspecified erectile dysfunction type - Plan: sildenafil (VIAGRA) 100 MG tablet   Meds ordered this encounter  Medications  . busPIRone (BUSPAR) 5 MG tablet    Sig: Take 1 tablet (5 mg total) by mouth daily as needed.    Dispense:  90 tablet    Refill:  1  . levothyroxine (SYNTHROID) 137 MCG tablet    Sig: Take 1 tablet (137 mcg total) by mouth daily.    Dispense:  90 tablet    Refill:  0  . fenofibrate micronized (LOFIBRA) 200 MG capsule    Sig: Take 1 capsule (200 mg  total) by mouth daily.    Dispense:  90 capsule    Refill:  0  . sildenafil (VIAGRA) 100 MG tablet    Sig: Take 0.5-1 tablets (50-100 mg total) by mouth daily as needed for erectile dysfunction.    Dispense:  10 tablet    Refill:  5   Patient Instructions  No change in chronic medications at this time.  If any concerns on labs I will let you know.  See information below on back pain.  Tylenol, range of motion, stretches okay for now.  Follow-up if that is not improving.  Glad to hear your finger pain, swelling has improved.  That could have been a flare of arthritis with recent moving as well.  If that does not continue to improve or any new symptoms return for recheck.  Other popping within the finger could be trigger finger, see information below.  If that becomes more frequent, let me know and I can refer you to a hand specialist.  Let me know if there are questions from today's visit and take care!  Acute Back Pain, Adult Acute back pain is sudden and usually short-lived. It is often caused by an injury to the muscles and tissues in the back. The injury may result from: A muscle, tendon, or ligament getting overstretched or torn. Ligaments are tissues that connect bones to each other. Lifting something improperly can cause a back strain. Wear and tear (degeneration) of the spinal disks. Spinal disks are circular tissue that provide cushioning between the bones of the spine (vertebrae). Twisting motions, such as while playing sports or doing yard work. A hit to the back. Arthritis. You may have a physical exam, lab tests, and imaging tests to find the cause of your pain. Acute back pain usually goes away with rest and home care. Follow these  instructions at home: Managing pain, stiffness, and swelling Take over-the-counter and prescription medicines only as told by your health care provider. Treatment may include medicines for pain and inflammation that are taken by mouth or applied to  the skin, or muscle relaxants. Your health care provider may recommend applying ice during the first 24-48 hours after your pain starts. To do this: Put ice in a plastic bag. Place a towel between your skin and the bag. Leave the ice on for 20 minutes, 2-3 times a day. Remove the ice if your skin turns bright red. This is very important. If you cannot feel pain, heat, or cold, you have a greater risk of damage to the area. If directed, apply heat to the affected area as often as told by your health care provider. Use the heat source that your health care provider recommends, such as a moist heat pack or a heating pad. Place a towel between your skin and the heat source. Leave the heat on for 20-30 minutes. Remove the heat if your skin turns bright red. This is especially important if you are unable to feel pain, heat, or cold. You have a greater risk of getting burned. Activity  Do not stay in bed. Staying in bed for more than 1-2 days can delay your recovery. Sit up and stand up straight. Avoid leaning forward when you sit or hunching over when you stand. If you work at a desk, sit close to it so you do not need to lean over. Keep your chin tucked in. Keep your neck drawn back, and keep your elbows bent at a 90-degree angle (right angle). Sit high and close to the steering wheel when you drive. Add lower back (lumbar) support to your car seat, if needed. Take short walks on even surfaces as soon as you are able. Try to increase the length of time you walk each day. Do not sit, drive, or stand in one place for more than 30 minutes at a time. Sitting or standing for long periods of time can put stress on your back. Do not drive or use heavy machinery while taking prescription pain medicine. Use proper lifting techniques. When you bend and lift, use positions that put less stress on your back: [place] your knees. Keep the load close to your body. Avoid twisting. Exercise regularly as told by your  health care provider. Exercising helps your back heal faster and helps prevent back injuries by keeping muscles strong and flexible. Work with a physical therapist to make a safe exercise program, as recommended by your health care provider. Do any exercises as told by your physical therapist. Lifestyle Maintain a healthy weight. Extra weight puts stress on your back and makes it difficult to have good posture. Avoid activities or situations that make you feel anxious or stressed. Stress and anxiety increase muscle tension and can make back pain worse. Learn ways to manage anxiety and stress, such as through exercise. General instructions Sleep on a firm mattress in a comfortable position. Try lying on your side with your knees slightly bent. If you lie on your back, put a pillow under your knees. Keep your head and neck in a straight line with your spine (neutral position) when using electronic equipment like smartphones or pads. To do this: Raise your smartphone or pad to look at it instead of bending your head or neck to look down. Put the smartphone or pad at the level of your face while looking at the screen.  Follow your treatment plan as told by your health care provider. This may include: Cognitive or behavioral therapy. Acupuncture or massage therapy. Meditation or yoga. Contact a health care provider if: You have pain that is not relieved with rest or medicine. You have increasing pain going down into your legs or buttocks. Your pain does not improve after 2 weeks. You have pain at night. You lose weight without trying. You have a fever or chills. You develop nausea or vomiting. You develop abdominal pain. Get help right away if: You develop new bowel or bladder control problems. You have unusual weakness or numbness in your arms or legs. You feel faint. These symptoms may represent a serious problem that is an emergency. Do not wait to see if the symptoms will go away. Get  medical help right away. Call your local emergency services (911 in the [place] Do not drive yourself to the hospital. Summary Acute back pain is sudden and usually short-lived. Use proper lifting techniques. When you bend and lift, use positions that put less stress on your back. Take over-the-counter and prescription medicines only as told by your health care provider, and apply heat or ice as told. This information is not intended to replace advice given to you by your health care provider. Make sure you discuss any questions you have with your health care provider. Document Revised: 02/12/2021 Document Reviewed: 02/12/2021 Elsevier Patient Education  2024 [employer]   Trigger Finger  Trigger finger, also called stenosing tenosynovitis, is a condition that causes a finger or thumb to get stuck in a bent position. Each finger has tough, cord-like tissue (tendon) that connects muscle to bone, and each tendon passes through a tunnel of tissue (tendon sheath). The tendon sheaths are held close to the bone by a pulley. There is a pulley called the A1 pulley that is involved in the triggering of a finger or thumb. To move your finger, your tendon needs to glide freely through the sheath. Trigger finger happens when the tendon or the sheath thickens, making it difficult to bend or straighten your finger as the thickened tendon gets stuck in the A1 pulley. Trigger finger can affect any of the fingers or the thumbs. Mild cases may clear up with rest and medicine. Severe cases require more treatment. What are the causes? Trigger finger or thumb is caused by a thickened finger tendon or tendon sheath. The cause of this thickening is not known. What increases the risk? The following factors may make you more likely to develop this condition: Doing the same movements many times (repetitive activity) that require a strong grip. Having certain health conditions. These include rheumatoid arthritis, gout,  carpal tunnel syndrome, or diabetes. Being 27-24 years old. Being male. What are the signs or symptoms? Symptoms of this condition include: Pain when bending or straightening your finger. Tenderness, swelling, or a lump in the palm of your hand just below the finger joint. Hearing a noise like a pop or a snap when you try to straighten your finger. Feeling a catch or locked feeling when you try to straighten your finger. Being unable to straighten your finger without help from your other hand. How is this diagnosed? This condition is diagnosed based on your symptoms and a physical exam. How is this treated? This condition may be treated by: Resting your finger and avoiding activities that make symptoms worse. Wearing a finger splint to keep your finger extended. Taking NSAIDs, such as ibuprofen, to relieve pain and swelling  around the tendon. Doing gentle exercises to stretch the finger as told by your health care provider. Having medicine that reduces swelling and inflammation (steroids) injected into the tendon sheath. Injections may need to be repeated. Trigger finger release. This surgery is done to open the pulley. This may be done if other treatments do not work and you cannot straighten or bend your finger. You may need hand therapy after surgery. Follow these instructions at home: If you have a removable splint: Wear the splint as told by your provider. Remove it only as told by your provider. Check the skin around the splint every day. Tell your provider about any concerns. Loosen the splint if your fingers tingle, become numb, or turn cold and blue. Keep the splint clean and dry. If the splint is not waterproof: Do not let it get wet. Cover it with a watertight covering when you take a bath or shower. Managing pain, stiffness, and swelling     If told, put ice on the painful area. If you have a removable splint, remove it as told by your health care provider. Put ice in  a plastic bag. Place a towel between your skin and the bag or between your splint and the bag. Leave the ice on for 20 minutes, 2-3 times a day. If told, apply heat to the affected area as often as told by your provider. Use the heat source that your provider recommends, such as a moist heat pack or a heating pad. Place a towel between your skin and the heat source. Leave the heat on for 20-30 minutes. If your skin turns bright red, remove the ice or heat right away to prevent skin damage. The risk of damage is higher if you cannot feel pain, heat, or cold. Activity Rest your finger as told by your provider. Avoid activities that make the pain worse. Return to your normal activities as told by your provider. Ask your provider what activities are safe for you. Do exercises as told by your provider. Ask your provider when it is safe to drive if you have a splint on your hand. General instructions Take over-the-counter and prescription medicines only as told by your provider. Keep all follow-up visits. These are needed to see how you are progressing. Contact a health care provider if: Your symptoms are not improving with home care. This information is not intended to replace advice given to you by your health care provider. Make sure you discuss any questions you have with your health care provider. Document Revised: 07/08/2022 Document Reviewed: 07/08/2022 Elsevier Patient Education  2024 [employer]     Signed,   Meredith Staggers, MD [facility], [facility] 09/08/23 11:00 AM

## 2023-09-08 NOTE — Patient Instructions (Signed)
No change in chronic medications at this time.  If any concerns on labs I will let you know.  See information below on back pain.  Tylenol, range of motion, stretches okay for now.  Follow-up if that is not improving.  Glad to hear your finger pain, swelling has improved.  That could have been a flare of arthritis with recent moving as well.  If that does not continue to improve or any new symptoms return for recheck.  Other popping within the finger could be trigger finger, see information below.  If that becomes more frequent, let me know and I can refer you to a hand specialist.  Let me know if there are questions from today's visit and take care!  Acute Back Pain, Adult Acute back pain is sudden and usually short-lived. It is often caused by an injury to the muscles and tissues in the back. The injury may result from: A muscle, tendon, or ligament getting overstretched or torn. Ligaments are tissues that connect bones to each other. Lifting something improperly can cause a back strain. Wear and tear (degeneration) of the spinal disks. Spinal disks are circular tissue that provide cushioning between the bones of the spine (vertebrae). Twisting motions, such as while playing sports or doing yard work. A hit to the back. Arthritis. You may have a physical exam, lab tests, and imaging tests to find the cause of your pain. Acute back pain usually goes away with rest and home care. Follow these instructions at home: Managing pain, stiffness, and swelling Take over-the-counter and prescription medicines only as told by your health care provider. Treatment may include medicines for pain and inflammation that are taken by mouth or applied to the skin, or muscle relaxants. Your health care provider may recommend applying ice during the first 24-48 hours after your pain starts. To do this: Put ice in a plastic bag. Place a towel between your skin and the bag. Leave the ice on for 20 minutes, 2-3 times  a day. Remove the ice if your skin turns bright red. This is very important. If you cannot feel pain, heat, or cold, you have a greater risk of damage to the area. If directed, apply heat to the affected area as often as told by your health care provider. Use the heat source that your health care provider recommends, such as a moist heat pack or a heating pad. Place a towel between your skin and the heat source. Leave the heat on for 20-30 minutes. Remove the heat if your skin turns bright red. This is especially important if you are unable to feel pain, heat, or cold. You have a greater risk of getting burned. Activity  Do not stay in bed. Staying in bed for more than 1-2 days can delay your recovery. Sit up and stand up straight. Avoid leaning forward when you sit or hunching over when you stand. If you work at a desk, sit close to it so you do not need to lean over. Keep your chin tucked in. Keep your neck drawn back, and keep your elbows bent at a 90-degree angle (right angle). Sit high and close to the steering wheel when you drive. Add lower back (lumbar) support to your car seat, if needed. Take short walks on even surfaces as soon as you are able. Try to increase the length of time you walk each day. Do not sit, drive, or stand in one place for more than 30 minutes at a time. Sitting  or standing for long periods of time can put stress on your back. Do not drive or use heavy machinery while taking prescription pain medicine. Use proper lifting techniques. When you bend and lift, use positions that put less stress on your back: [place] your knees. Keep the load close to your body. Avoid twisting. Exercise regularly as told by your health care provider. Exercising helps your back heal faster and helps prevent back injuries by keeping muscles strong and flexible. Work with a physical therapist to make a safe exercise program, as recommended by your health care provider. Do any exercises as told  by your physical therapist. Lifestyle Maintain a healthy weight. Extra weight puts stress on your back and makes it difficult to have good posture. Avoid activities or situations that make you feel anxious or stressed. Stress and anxiety increase muscle tension and can make back pain worse. Learn ways to manage anxiety and stress, such as through exercise. General instructions Sleep on a firm mattress in a comfortable position. Try lying on your side with your knees slightly bent. If you lie on your back, put a pillow under your knees. Keep your head and neck in a straight line with your spine (neutral position) when using electronic equipment like smartphones or pads. To do this: Raise your smartphone or pad to look at it instead of bending your head or neck to look down. Put the smartphone or pad at the level of your face while looking at the screen. Follow your treatment plan as told by your health care provider. This may include: Cognitive or behavioral therapy. Acupuncture or massage therapy. Meditation or yoga. Contact a health care provider if: You have pain that is not relieved with rest or medicine. You have increasing pain going down into your legs or buttocks. Your pain does not improve after 2 weeks. You have pain at night. You lose weight without trying. You have a fever or chills. You develop nausea or vomiting. You develop abdominal pain. Get help right away if: You develop new bowel or bladder control problems. You have unusual weakness or numbness in your arms or legs. You feel faint. These symptoms may represent a serious problem that is an emergency. Do not wait to see if the symptoms will go away. Get medical help right away. Call your local emergency services (911 in the [place] Do not drive yourself to the hospital. Summary Acute back pain is sudden and usually short-lived. Use proper lifting techniques. When you bend and lift, use positions that put less stress on  your back. Take over-the-counter and prescription medicines only as told by your health care provider, and apply heat or ice as told. This information is not intended to replace advice given to you by your health care provider. Make sure you discuss any questions you have with your health care provider. Document Revised: 02/12/2021 Document Reviewed: 02/12/2021 Elsevier Patient Education  2024 [employer]   Trigger Finger  Trigger finger, also called stenosing tenosynovitis, is a condition that causes a finger or thumb to get stuck in a bent position. Each finger has tough, cord-like tissue (tendon) that connects muscle to bone, and each tendon passes through a tunnel of tissue (tendon sheath). The tendon sheaths are held close to the bone by a pulley. There is a pulley called the A1 pulley that is involved in the triggering of a finger or thumb. To move your finger, your tendon needs to glide freely through the sheath. Trigger finger happens when  the tendon or the sheath thickens, making it difficult to bend or straighten your finger as the thickened tendon gets stuck in the A1 pulley. Trigger finger can affect any of the fingers or the thumbs. Mild cases may clear up with rest and medicine. Severe cases require more treatment. What are the causes? Trigger finger or thumb is caused by a thickened finger tendon or tendon sheath. The cause of this thickening is not known. What increases the risk? The following factors may make you more likely to develop this condition: Doing the same movements many times (repetitive activity) that require a strong grip. Having certain health conditions. These include rheumatoid arthritis, gout, carpal tunnel syndrome, or diabetes. Being 46-56 years old. Being male. What are the signs or symptoms? Symptoms of this condition include: Pain when bending or straightening your finger. Tenderness, swelling, or a lump in the palm of your hand just below the finger  joint. Hearing a noise like a pop or a snap when you try to straighten your finger. Feeling a catch or locked feeling when you try to straighten your finger. Being unable to straighten your finger without help from your other hand. How is this diagnosed? This condition is diagnosed based on your symptoms and a physical exam. How is this treated? This condition may be treated by: Resting your finger and avoiding activities that make symptoms worse. Wearing a finger splint to keep your finger extended. Taking NSAIDs, such as ibuprofen, to relieve pain and swelling around the tendon. Doing gentle exercises to stretch the finger as told by your health care provider. Having medicine that reduces swelling and inflammation (steroids) injected into the tendon sheath. Injections may need to be repeated. Trigger finger release. This surgery is done to open the pulley. This may be done if other treatments do not work and you cannot straighten or bend your finger. You may need hand therapy after surgery. Follow these instructions at home: If you have a removable splint: Wear the splint as told by your provider. Remove it only as told by your provider. Check the skin around the splint every day. Tell your provider about any concerns. Loosen the splint if your fingers tingle, become numb, or turn cold and blue. Keep the splint clean and dry. If the splint is not waterproof: Do not let it get wet. Cover it with a watertight covering when you take a bath or shower. Managing pain, stiffness, and swelling     If told, put ice on the painful area. If you have a removable splint, remove it as told by your health care provider. Put ice in a plastic bag. Place a towel between your skin and the bag or between your splint and the bag. Leave the ice on for 20 minutes, 2-3 times a day. If told, apply heat to the affected area as often as told by your provider. Use the heat source that your provider recommends,  such as a moist heat pack or a heating pad. Place a towel between your skin and the heat source. Leave the heat on for 20-30 minutes. If your skin turns bright red, remove the ice or heat right away to prevent skin damage. The risk of damage is higher if you cannot feel pain, heat, or cold. Activity Rest your finger as told by your provider. Avoid activities that make the pain worse. Return to your normal activities as told by your provider. Ask your provider what activities are safe for you. Do exercises as told  by your provider. Ask your provider when it is safe to drive if you have a splint on your hand. General instructions Take over-the-counter and prescription medicines only as told by your provider. Keep all follow-up visits. These are needed to see how you are progressing. Contact a health care provider if: Your symptoms are not improving with home care. This information is not intended to replace advice given to you by your health care provider. Make sure you discuss any questions you have with your health care provider. Document Revised: 07/08/2022 Document Reviewed: 07/08/2022 Elsevier Patient Education  2024 [employer].

## 2023-09-10 ENCOUNTER — Encounter: Payer: Self-pay | Admitting: Family Medicine

## 2023-09-13 ENCOUNTER — Telehealth: Payer: Self-pay

## 2023-09-13 NOTE — Telephone Encounter (Signed)
-----   Message from Shade Flood sent at 09/13/2023  8:45 AM EDT ----- Call patient.  Thyroid test was normal.  Electrolytes, kidney, liver tests overall looked okay.  Borderline low alkaline phosphatase has been stable.  Cholesterol levels were elevated, similar to previous readings.  We could certainly discuss changing cholesterol meds and possible statin unless he wants to continue to try diet/exercise approach with recheck levels in 3 to 6 months.  Let me know.

## 2023-09-14 NOTE — Telephone Encounter (Signed)
Pt wants to try diet and exercise, notes will try harder

## 2023-09-23 ENCOUNTER — Other Ambulatory Visit: Payer: Self-pay | Admitting: Family Medicine

## 2023-09-23 DIAGNOSIS — E781 Pure hyperglyceridemia: Secondary | ICD-10-CM

## 2023-09-23 DIAGNOSIS — E782 Mixed hyperlipidemia: Secondary | ICD-10-CM

## 2023-11-30 ENCOUNTER — Encounter: Payer: Self-pay | Admitting: Family Medicine

## 2023-11-30 ENCOUNTER — Ambulatory Visit: Payer: 59 | Admitting: Family Medicine

## 2023-11-30 VITALS — BP 118/76 | HR 69 | Temp 98.0°F | Ht 70.0 in | Wt 225.2 lb

## 2023-11-30 DIAGNOSIS — M545 Low back pain, unspecified: Secondary | ICD-10-CM

## 2023-11-30 DIAGNOSIS — D171 Benign lipomatous neoplasm of skin and subcutaneous tissue of trunk: Secondary | ICD-10-CM

## 2023-11-30 DIAGNOSIS — H539 Unspecified visual disturbance: Secondary | ICD-10-CM | POA: Diagnosis not present

## 2023-11-30 DIAGNOSIS — M4310 Spondylolisthesis, site unspecified: Secondary | ICD-10-CM

## 2023-11-30 MED ORDER — MELOXICAM 7.5 MG PO TABS
7.5000 mg | ORAL_TABLET | Freq: Every day | ORAL | 0 refills | Status: DC
Start: 2023-11-30 — End: 2023-12-29

## 2023-11-30 NOTE — Progress Notes (Signed)
Subjective:  Patient ID: Ryan Kelly, male    DOB: 11-Jan-1971  Age: 52 y.o. MRN: [id number]  CC:  Chief Complaint  Patient presents with   Back Pain    Pt notes pain in the back and hip for a long time, notes he has a lump on his lower Rt back for proximately 31, never changed in size and has never been tender to the touch but pain begins directly below this lump     HPI Ryan Kelly presents for   Back/hip pain Discussed at his October 4 visit.  Had noticed some low back pain at that time for few weeks red flags on history exam.  Pain is in the right low back towards the buttocks at times, had experienced previously and treated with home exercises, stretches and symptomatic care.  Had been using Tylenol, Naprosyn with some improvement at his last visit. Suspected mechanical low back pain at that time and likely from recent move.  Plan for continued symptomatic care with RTC precautions.   Not improving. Similar pain as last visit. Notices more often and notices at night with more daytime activity. Less long walks with dog d/t pain. Pain moves into R hip and thigh muscle at times, no other radiation.  No bowel or bladder incontinence, no saddle anesthesia, no lower extremity weakness. No fevers, night sweats, or weight loss.  No prior XR or PT treatment. Lump on skin in R low back that has been there for a long time for years - no change in size, drainage or redness. Concerned as sister in law with skin bump that turned out to be a cancer. Would like to have it checked.   Additional concern at end of visit.  Was referred to eye specialist in the past, missed 2 visits and not sure if he needs a new referral.  This was discussed in April, intermittent dark spot in his temporal visual field of the left eye. Improved, then same spot over past week or so. No new symptoms, no worsening. Has not tried to call prior office.    History Patient Active Problem List   Diagnosis Date Noted    Primary osteoarthritis of first carpometacarpal joint of right hand 09/22/2022   Primary osteoarthritis of first carpometacarpal joint of left hand 09/22/2022   Past Medical History:  Diagnosis Date   Anxiety    Arthritis    Heartburn    past hx   Hyperlipidemia    Thyroid disease    No past surgical history on file. No Known Allergies Prior to Admission medications   Medication Sig Start Date End Date Taking? Authorizing Provider  fenofibrate micronized (LOFIBRA) 200 MG capsule Take 1 capsule (200 mg total) by mouth daily. 09/08/23  Yes Shade Flood, MD  levothyroxine (SYNTHROID) 137 MCG tablet Take 1 tablet (137 mcg total) by mouth daily. 09/08/23  Yes Shade Flood, MD  sildenafil (VIAGRA) 100 MG tablet Take 0.5-1 tablets (50-100 mg total) by mouth daily as needed for erectile dysfunction. 09/08/23  Yes Shade Flood, MD  busPIRone (BUSPAR) 5 MG tablet Take 1 tablet (5 mg total) by mouth daily as needed. Patient not taking: Reported on 11/30/2023 09/08/23   Shade Flood, MD  diclofenac (VOLTAREN) 75 MG EC tablet Take 1 tablet (75 mg total) by mouth 2 (two) times daily. Patient not taking: Reported on 11/30/2023 09/22/22   Tarry Kos, MD   Social History   Socioeconomic History   Marital status:  Married    Spouse name: Melisssa   Number of children: Not on file   Years of education: Not on file   Highest education level: Not on file  Occupational History   Not on file  Tobacco Use   Smoking status: Every Day    Current packs/day: 1.00    Types: Cigarettes   Smokeless tobacco: Never  Substance and Sexual Activity   Alcohol use: Yes    Comment: daily 6 or so   Drug use: Yes    Types: Marijuana    Comment: little   Sexual activity: Yes    Birth control/protection: None  Other Topics Concern   Not on file  Social History Narrative   Not on file   Social Drivers of Health   Financial Resource Strain: Not on file  Food Insecurity: Not on file   Transportation Needs: Not on file  Physical Activity: Not on file  Stress: Not on file  Social Connections: Not on file  Intimate Partner Violence: Not on file    Review of Systems   Objective:   Vitals:   11/30/23 1104  BP: 118/76  Pulse: 69  Temp: 98 F (36.7 C)  TempSrc: Temporal  SpO2: 97%  Weight: 225 lb 3.2 oz (102.2 kg)  Height: 5\' 10"  (1.778 m)     Physical Exam Vitals reviewed.  Constitutional:      General: He is not in acute distress.    Appearance: Normal appearance. He is well-developed.  HENT:     Head: Normocephalic and atraumatic.  Cardiovascular:     Rate and Rhythm: Normal rate.  Pulmonary:     Effort: Pulmonary effort is normal.  Musculoskeletal:     Comments: Lumbar spine, no midline bony tenderness.  Slight discomfort of the right lower paraspinals.  No SI joint tenderness.  Negative seated straight leg raise, able to heel and toe walk without difficulty.  Pain-free right hip internal/external rotation.  No focal tenderness over the trochanteric bursa.  Ambulating without assistive device.   Skin:    Comments: Left mid to lower back, approximately 1 cm rounded firm area just underneath the skin, no skin changes.  Nontender.  Neurological:     Mental Status: He is alert and oriented to person, place, and time.  Psychiatric:        Mood and Affect: Mood normal.        Assessment & Plan:  Dajaun Long is a 52 y.o. male . Recurrent low back pain - Plan: DG Lumbar Spine Complete, meloxicam (MOBIC) 7.5 MG tablet, Ambulatory referral to Physical Therapy  -Suspect mechanical low back pain, recurrent.  No red flags on exam or history.  Trial of physical therapy, check imaging, consider back specialist if not improving with 6 weeks PT versus advanced imaging.  RTC precautions if worsening sooner.  Lipoma of back - Plan: Ambulatory referral to Dermatology  -Suspected lipoma based on exam and history, referred to dermatology for further  evaluation.  Concerns discussed as above.  Change in vision  -See previous notes, referred in April but he went twice.  Symptoms had improved and then recurrence of same symptoms recently.  Denies new visual symptoms.  Advised to call the previous eye specialist to see if they are still able to evaluate him, or I can place a new referral if needed.  He will let me know.   Meds ordered this encounter  Medications   meloxicam (MOBIC) 7.5 MG tablet    Sig: Take  1 tablet (7.5 mg total) by mouth daily.    Dispense:  30 tablet    Refill:  0   Patient Instructions  Information low back pain.  I am referring you to physical therapy, please have x-ray performed at the [facility] location below.  If any concerns on x-ray I will let you know.  I will refer you to dermatology to evaluate the bump of the back but without changes in size, I suspect that is probably a benign lipoma.  Again dermatology can advise you further.  Please call the previous eye specialist to see if they are still able to evaluate you for the left eye symptoms.  If they need a new referral, please let me know and I am happy to place one.  Return to the clinic or go to the nearest emergency room if any of your symptoms worsen or new symptoms occur.  Thanks for coming in today and take care.    Signed,   Meredith Staggers, MD [facility], [facility] 11/30/23 11:57 AM

## 2023-11-30 NOTE — Patient Instructions (Addendum)
Information low back pain.  I am referring you to physical therapy, please have x-ray performed at the [facility] location below.  If any concerns on x-ray I will let you know.  I will refer you to dermatology to evaluate the bump of the back but without changes in size, I suspect that is probably a benign lipoma.  Again dermatology can advise you further.  Please call the previous eye specialist to see if they are still able to evaluate you for the left eye symptoms.  If they need a new referral, please let me know and I am happy to place one.  Return to the clinic or go to the nearest emergency room if any of your symptoms worsen or new symptoms occur.  Thanks for coming in today and take care.  [facility] or xray: Walk in 8:30-4:30 during weekdays, no appointment needed [address]  [place], [place] [address]  Chronic Back Pain Chronic back pain is back pain that lasts longer than 3 months. The cause of your back pain may not be known. Some common causes include: Wear and tear (degenerative disease) of the bones, disks, or tissues that connect bones to each other (ligaments) in your back. Inflammation and stiffness in your back (arthritis). If you have chronic back pain, you may have times when the pain is more intense (flare-ups). You can also learn to manage the pain with home care. Follow these instructions at home: Watch for any changes in your symptoms. Take these actions to help with your pain: Managing pain and stiffness     If told, put ice on the painful area. You may be told to apply ice for the first 24-48 hours after a flare-up starts. Put ice in a plastic bag. Place a towel between your skin and the bag. Leave the ice on for 20 minutes, 2-3 times per day. If told, apply heat to the affected area as often as told by your health care provider. Use the heat source that your provider recommends, such as a moist heat pack or a heating pad. Place a towel between your  skin and the heat source. Leave the heat on for 20-30 minutes. If your skin turns bright red, remove the ice or heat right away to prevent skin damage. The risk of damage is higher if you cannot feel pain, heat, or cold. Try soaking in a warm tub. Activity        Avoid bending and other activities that make the pain worse. Have good posture when you stand or sit. When you stand, keep your upper back and neck straight, with your shoulders pulled back. Avoid slouching. When you sit, keep your back straight. Relax your shoulders. Do not round your shoulders or pull them backward. Do not sit or stand in one place for too long. Take brief periods of rest during the day. This will reduce your pain. Resting in a lying or standing position is often better than sitting to rest. When you rest for longer periods, mix in some mild activity or stretching between periods of rest. This will help to prevent stiffness and pain. Get regular exercise. Ask your provider what activities are safe for you. You may have to avoid lifting. Ask your provider how much you can safely lift. If you do lift, always use the right technique. This means you should: Bend your knees. Keep the load close to your body. Avoid twisting. Medicines Take over-the-counter and prescription medicines only as told  by your provider. You may need to take medicines for pain and inflammation. These may be taken by mouth or put on the skin. You may also be given muscle relaxants. Ask your provider if the medicine prescribed to you: Requires you to avoid driving or using machinery. Can cause constipation. You may need to take these actions to prevent or treat constipation: Drink enough fluid to keep your pee (urine) pale yellow. Take over-the-counter or prescription medicines. Eat foods that are high in fiber, such as beans, whole grains, and fresh fruits and vegetables. Limit foods that are high in fat and processed sugars, such as fried  or sweet foods. General instructions  Sleep on a firm mattress in a comfortable position. Try lying on your side with your knees slightly bent. If you lie on your back, put a pillow under your knees. Do not use any products that contain nicotine or tobacco. These products include cigarettes, chewing tobacco, and vaping devices, such as e-cigarettes. If you need help quitting, ask your provider. Contact a health care provider if: You have pain that does not get better with rest or medicine. You have new pain. You have a fever. You lose weight quickly. You have trouble doing your normal activities. You feel weak or numb in one or both of your legs or feet. Get help right away if: You are not able to control when you pee or poop. You have severe back pain and: Nausea or vomiting. Pain in your chest or abdomen. Shortness of breath. You faint. These symptoms may be an emergency. Get help right away. Call 911. Do not wait to see if the symptoms will go away. Do not drive yourself to the hospital. This information is not intended to replace advice given to you by your health care provider. Make sure you discuss any questions you have with your health care provider. Document Revised: 07/11/2022 Document Reviewed: 07/11/2022 Elsevier Patient Education  2024 [employer].

## 2023-12-12 ENCOUNTER — Other Ambulatory Visit: Payer: Self-pay | Admitting: Family Medicine

## 2023-12-12 DIAGNOSIS — E781 Pure hyperglyceridemia: Secondary | ICD-10-CM

## 2023-12-12 DIAGNOSIS — E782 Mixed hyperlipidemia: Secondary | ICD-10-CM

## 2023-12-26 NOTE — Therapy (Signed)
OUTPATIENT PHYSICAL THERAPY THORACOLUMBAR EVALUATION   Patient Name: Ryan Kelly MRN: [id number] DOB:June 03, 1971, 53 y.o., male Today's Date: 12/27/2023  END OF SESSION:  PT End of Session - 12/27/23 1013     Visit Number 1    Number of Visits 13    Date for PT Re-Evaluation 03/01/24    Authorization Type [employer] [facility]    PT Start Time 1015    PT Stop Time 1100    PT Time Calculation (min) 45 min    Activity Tolerance Patient tolerated treatment well    Behavior During Therapy WFL for tasks assessed/performed             Past Medical History:  Diagnosis Date   Anxiety    Arthritis    Heartburn    past hx   Hyperlipidemia    Thyroid disease    History reviewed. No pertinent surgical history. Patient Active Problem List   Diagnosis Date Noted   Primary osteoarthritis of first carpometacarpal joint of right hand 09/22/2022   Primary osteoarthritis of first carpometacarpal joint of left hand 09/22/2022    PCP: Shade Flood, MD   REFERRING PROVIDER: Shade Flood, MD   REFERRING DIAG: M54.50 (ICD-10-CM) - Recurrent low back pain   Rationale for Evaluation and Treatment: Rehabilitation  THERAPY DIAG:  Chronic right-sided low back pain with right-sided sciatica  Muscle weakness (generalized)  ONSET DATE: Since Sept 2024  SUBJECTIVE:                                                                                                                                                                                           SUBJECTIVE STATEMENT: Pt reports he injured low back in Sept after lifting alot and overusing his low back. He also had an issue with increased low back pain around that time after extended driving. His low back was not getting better, but since receiving a prescription of meloxicam from Dr. Neva Seat, which he has taken for approx 1 month, his low back is feeling better. Pt also endorses R gluteal/lateral hip pain and infrequent knee  and ankle pain. Pt denies N/T. His prescription of meloxicam runs out next week.  PERTINENT HISTORY:  Tobacco use, high BMI  PAIN:  Are you having pain? Yes: NPRS scale: 1/10; after physical activity: 5/10. Pain range the week prior to start of PT: 0-5/10. Pain location: R low back and lateral hip Pain description: ache Aggravating factors: Prolonged standing, lifting Relieving factors: meloxicam, rest  PRECAUTIONS: None  RED FLAGS: None   WEIGHT BEARING RESTRICTIONS: No  FALLS:  Has patient fallen in last 6 months?  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 Neva Seat  OBJECTIVE:  Note: Objective measures were completed at Evaluation unless otherwise noted.  DIAGNOSTIC FINDINGS:  Pt to have completed  PATIENT SURVEYS:  FOTO: Perceived function   52%, predicted   73%   COGNITION: Overall cognitive status: Within functional limits for tasks assessed     SENSATION: WFL  MUSCLE LENGTH: Hamstrings: Right WNLs deg; Left WNLs deg Maisie Fus test: Right WNLs deg; Left WNLs deg  POSTURE: No Significant postural limitations  PALPATION: Not overtly TTP of the low back or R hip  LUMBAR ROM:   AROM eval  Flexion Full; min p  Extension Full, p  Right lateral flexion Full, p  Left lateral flexion Full  Right rotation Full  Left rotation Full, min p  P=concordant pain  (Blank rows = not tested)  LOWER EXTREMITY ROM:    Grossly WNLs s reproduction of R hip pain Active  Right eval Left eval  Hip flexion    Hip extension    Hip abduction    Hip adduction    Hip internal rotation    Hip external rotation    Knee flexion    Knee extension    Ankle dorsiflexion    Ankle plantarflexion    Ankle inversion    Ankle eversion     (Blank rows = not tested)  LOWER EXTREMITY MMT:    Weak core= sustained bridge 45". Sustained plank 10" MMT Right eval Left eval  Hip flexion 5 5  Hip extension    Hip abduction 5 5  Hip adduction    Hip internal rotation    Hip external rotation 5 5  Knee flexion 5 5  Knee extension 5 5  Ankle dorsiflexion 5 5  Ankle plantarflexion 5 5  Ankle inversion    Ankle eversion     (Blank rows = not tested)  LUMBAR SPECIAL TESTS:  Straight leg raise test: Negative, Slump test: Negative, SI Compression/distraction test: Negative, and FABER test: Negative  FUNCTIONAL TESTS:  NT  GAIT: Distance walked: 200' Assistive device utilized: None Level of assistance: Complete Independence Comments: WNLs  TREATMENT DATE:  [facility] Adult PT Treatment:                                                DATE: 12/27/23 Therapeutic Exercise: Developed, instructed in, and pt completed therex as noted in HEP   PATIENT EDUCATION:  Education details: Eval findings, POC, HEP Person educated: Patient Education method: Explanation, Demonstration, Tactile cues, Verbal cues, and Handouts Education comprehension: verbalized understanding, returned demonstration, verbal cues required, and tactile cues required  HOME EXERCISE PROGRAM: Access Code: [facility] URL: [email or web address] Date: 12/27/2023 Prepared by: Joellyn Rued  Exercises - Hooklying Single Knee to Chest  - 1-2 x daily - 7 x weekly - 1 sets - 3 reps - 20 hold - Supine Piriformis Stretch with Foot on Ground  - 1-2 x daily - 7 x weekly - 1 sets - 3 reps - 20 hold - Supine Lower Trunk Rotation  - 1-2 x daily - 7 x weekly - 1 sets - 10 reps - 5 hold - Supine Transversus Abdominis Bracing - Hands on Ground  -  1-2 x daily - 7 x weekly - 2 sets - 10 reps - 3 hold - Supine Bridge  - 1-2 x daily - 7 x weekly - 2 sets - 10 reps - 5 hold  ASSESSMENT:  CLINICAL IMPRESSION: Patient is a 53 y.o. male who was seen today for physical therapy evaluation and treatment for M54.50 (ICD-10-CM) -  Recurrent low back pain. Pt presents to PT with an improving episode of chronic low back and R lat hip pain which has been doing so since starting meloxicam approx 1 month ago. Pain appears mechanical in nature and is reproduced most significantly c back extension and R side bending. The myotome screen was negative, while pt's core is weak. Pt was started on a HEP. Pt will benefit from skilled PT 1w8 to address impairments to optimize back function with less pain.    OBJECTIVE IMPAIRMENTS: decreased activity tolerance, decreased strength, obesity, and pain.   ACTIVITY LIMITATIONS: carrying, lifting, bending, standing, and squatting  PARTICIPATION LIMITATIONS: cleaning, occupation, and yard work  PERSONAL FACTORS: Fitness, Past/current experiences, Time since onset of injury/illness/exacerbation, and 1-2 comorbidities: tobaccco use, high BMI  are also affecting patient's functional outcome.   REHAB POTENTIAL: Good  CLINICAL DECISION MAKING: Evolving/moderate complexity  EVALUATION COMPLEXITY: Moderate   GOALS:  SHORT TERM GOALS: Target date: 01/19/24  Pt will be Ind in an initial HEP Baseline: started Goal status: INITIAL  LONG TERM GOALS: Target date: 03/01/24  Pt will be Ind in a final HEP to maintain achieved LOF  Baseline: started Goal status: INITIAL  2.  Pt will demonstrate improved core strength by tolerated a bridge for 90 sec and a plank from his toes for 45 sec Baseline: bridge 45 sec, plank 10 sec Goal status: INITIAL  3.  Pt will report 75% or greater improvement in his R low back and R lat hip pain for improved function and QOL. Baseline: 0-5/10 Goal status: INITIAL  4.  Pt will demonstrate proper body mechanics for lifting and household activities to minimize incidents of aggravating his low back pain.  Baseline:  Goal status: INITIAL  5.  Pt's FOTO score will improved to the predicted value of 73% as indication of improved function  Baseline: 52% Goal status:  INITIAL  PLAN:  PT FREQUENCY: 1x/week  PT DURATION: 8 weeks  PLANNED INTERVENTIONS: 97164- PT Re-evaluation, 97110-Therapeutic exercises, 97530- Therapeutic activity, 97112- Neuromuscular re-education, 97535- Self Care, 16109- Manual therapy, 97014- Electrical stimulation (unattended), [id number]- Electrical stimulation (manual), Patient/Family education, Taping, Dry Needling, Joint mobilization, Spinal mobilization, Cryotherapy, and Moist heat.  PLAN FOR NEXT SESSION: Review FOTO; assess response to HEP; progress therex as indicated; use of modalities, manual therapy; and TPDN as indicated.  Sidnee Gambrill MS, PT 12/27/23 4:25 PM

## 2023-12-27 ENCOUNTER — Other Ambulatory Visit: Payer: Self-pay

## 2023-12-27 ENCOUNTER — Ambulatory Visit: Payer: 59 | Attending: Family Medicine

## 2023-12-27 DIAGNOSIS — M6281 Muscle weakness (generalized): Secondary | ICD-10-CM | POA: Diagnosis not present

## 2023-12-27 DIAGNOSIS — M545 Low back pain, unspecified: Secondary | ICD-10-CM | POA: Diagnosis not present

## 2023-12-27 DIAGNOSIS — G8929 Other chronic pain: Secondary | ICD-10-CM | POA: Insufficient documentation

## 2023-12-27 DIAGNOSIS — M5441 Lumbago with sciatica, right side: Secondary | ICD-10-CM | POA: Diagnosis not present

## 2023-12-29 ENCOUNTER — Other Ambulatory Visit: Payer: Self-pay | Admitting: Family Medicine

## 2023-12-29 DIAGNOSIS — M545 Low back pain, unspecified: Secondary | ICD-10-CM

## 2023-12-29 NOTE — Telephone Encounter (Signed)
Suspected mechanical low back pain discussed 11/30/2023.  Continued on meloxicam at that time with PT, refill ordered.

## 2024-01-02 NOTE — Therapy (Signed)
OUTPATIENT PHYSICAL THERAPY THORACOLUMBAR TREATMENT   Patient Name: Ryan Kelly MRN: [id number] DOB:[birth year], 53 y.o., male Today's Date: 01/03/2024  END OF SESSION:  PT End of Session - 01/03/24 1734     Visit Number 2    Number of Visits 13    Date for PT Re-Evaluation 03/01/24    Authorization Type [employer] [facility]    PT Start Time 1551    PT Stop Time 1632    PT Time Calculation (min) 41 min    Activity Tolerance Patient tolerated treatment well    Behavior During Therapy WFL for tasks assessed/performed              Past Medical History:  Diagnosis Date   Anxiety    Arthritis    Heartburn    past hx   Hyperlipidemia    Thyroid disease    History reviewed. No pertinent surgical history. Patient Active Problem List   Diagnosis Date Noted   Primary osteoarthritis of first carpometacarpal joint of right hand 09/22/2022   Primary osteoarthritis of first carpometacarpal joint of left hand 09/22/2022    PCP: Shade Flood, MD   REFERRING PROVIDER: Shade Flood, MD   REFERRING DIAG: M54.50 (ICD-10-CM) - Recurrent low back pain   Rationale for Evaluation and Treatment: Rehabilitation  THERAPY DIAG:  Chronic right-sided low back pain with right-sided sciatica  Muscle weakness (generalized)  ONSET DATE: Since Sept 2024  SUBJECTIVE:                                                                                                                                                                                           SUBJECTIVE STATEMENT: Pt HEP seem to help out. I spaced out taking the meloxicam and my low back has bothered me more.  EVAL: Pt reports he injured low back in Sept after lifting alot and overusing his low back. He also had an issue with increased low back pain around that time after extended driving. His low back was not getting better, but since receiving a prescription of meloxicam from Dr. Neva Seat, which he has taken for  approx 1 month, his low back is feeling better. Pt also endorses R gluteal/lateral hip pain and infrequent knee and ankle pain. Pt denies N/T. His prescription of meloxicam runs out next week.  PERTINENT HISTORY:  Tobacco use, high BMI  PAIN:  Are you having pain? Yes: NPRS scale: 1/10; after physical activity: 5/10. Pain range the week prior to start of PT: 0-5/10. Pain location: R low back and lateral hip Pain description: ache Aggravating factors: Prolonged standing, lifting Relieving factors: meloxicam, rest  PRECAUTIONS: None  RED FLAGS: None   WEIGHT BEARING RESTRICTIONS: No  FALLS:  Has patient fallen in last 6 months? 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 Neva Seat  OBJECTIVE:  Note: Objective measures were completed at Evaluation unless otherwise noted.  DIAGNOSTIC FINDINGS:  Pt to have completed  PATIENT SURVEYS:  FOTO: Perceived function   52%, predicted   73%   COGNITION: Overall cognitive status: Within functional limits for tasks assessed     SENSATION: WFL  MUSCLE LENGTH: Hamstrings: Right WNLs deg; Left WNLs deg Maisie Fus test: Right WNLs deg; Left WNLs deg  POSTURE: No Significant postural limitations  PALPATION: Not overtly TTP of the low back or R hip  LUMBAR ROM:   AROM eval  Flexion Full; min p  Extension Full, p  Right lateral flexion Full, p  Left lateral flexion Full  Right rotation Full  Left rotation Full, min p  P=concordant pain  (Blank rows = not tested)  LOWER EXTREMITY ROM:    Grossly WNLs s reproduction of R hip pain Active  Right eval Left eval  Hip flexion    Hip extension    Hip abduction    Hip adduction    Hip internal rotation    Hip external rotation    Knee flexion    Knee extension    Ankle dorsiflexion    Ankle  plantarflexion    Ankle inversion    Ankle eversion     (Blank rows = not tested)  LOWER EXTREMITY MMT:   Weak core= sustained bridge 45". Sustained plank 10" MMT Right eval Left eval  Hip flexion 5 5  Hip extension    Hip abduction 5 5  Hip adduction    Hip internal rotation    Hip external rotation 5 5  Knee flexion 5 5  Knee extension 5 5  Ankle dorsiflexion 5 5  Ankle plantarflexion 5 5  Ankle inversion    Ankle eversion     (Blank rows = not tested)  LUMBAR SPECIAL TESTS:  Straight leg raise test: Negative, Slump test: Negative, SI Compression/distraction test: Negative, and FABER test: Negative  FUNCTIONAL TESTS:  NT  GAIT: Distance walked: 200' Assistive device utilized: None Level of assistance: Complete Independence Comments: WNLs  TREATMENT DATE:  [facility] Adult PT Treatment:                                                DATE: 01/03/24 Therapeutic Exercise: Hooklying Single Knee to Chest 2 reps - 20 hold Supine Piriformis Stretch 2 reps - 20 hold Supine Lower Trunk Rotation 10 reps - 5 hold Supine Transversus Abdominis Bracing 10 reps - 3 hold Supine Bridge  - 1-2 x daily 10 reps - 5 hold Right Standing Lateral Shift Correction at Wall 10 reps - 1 hold TL Sidebending Stretch Single Arm Overhead 5 reps 10 hold Supine 90/90 Abdominal Bracing 10 reps - 10 hold Prone Hip Extension 10 reps - 3 hold Updated HEP  [facility] Adult PT Treatment:  DATE: 12/27/23 Therapeutic Exercise: Developed, instructed in, and pt completed therex as noted in HEP   PATIENT EDUCATION:  Education details: Eval findings, POC, HEP Person educated: Patient Education method: Explanation, Demonstration, Tactile cues, Verbal cues, and Handouts Education comprehension: verbalized understanding, returned demonstration, verbal cues required, and tactile cues required  HOME EXERCISE PROGRAM: Access Code: [facility] URL:  [email or web address] Date: 01/03/2024 Prepared by: Joellyn Rued  Exercises - Hooklying Single Knee to Chest  - 1-2 x daily - 7 x weekly - 1 sets - 3 reps - 20 hold - Supine Piriformis Stretch with Foot on Ground  - 1-2 x daily - 7 x weekly - 1 sets - 3 reps - 20 hold - Supine Lower Trunk Rotation  - 1-2 x daily - 7 x weekly - 1 sets - 10 reps - 5 hold - Supine Transversus Abdominis Bracing - Hands on Ground  - 1-2 x daily - 7 x weekly - 2 sets - 10 reps - 3 hold - Supine Bridge  - 1-2 x daily - 7 x weekly - 2 sets - 10 reps - 5 hold - Right Standing Lateral Shift Correction at Wall - Hold  - 1-2 x daily - 7 x weekly - 2 sets - 10 reps - 1 hold - TL Sidebending Stretch - Single Arm Overhead  - 1-2 x daily - 7 x weekly - 1 sets - 5 reps - 5-10 hold - Supine 90/90 Abdominal Bracing  - 1-2 x daily - 7 x weekly - 1 sets - 10 reps - 10 hold - Prone Hip Extension  - 1-2 x daily - 7 x weekly - 1 sets - 10 reps - 3 hold  ASSESSMENT:  CLINICAL IMPRESSION: PT was completed for lumbopevic flexibility and strengthening with the flexibility component more focused on the R low back. Pt reports consistent completion of his HEP and that it seems to help his low back feel better. Additional stretches and strengthening therex were added. Verbal and tactile cueing was provided to assist pt in completing the prescribe therex with the most proper technique. Pt will continue to benefit from skilled PT to address impairments for improved back function c minimized pain.   EVAL: Patient is a 53 y.o. male who was seen today for physical therapy evaluation and treatment for M54.50 (ICD-10-CM) - Recurrent low back pain. Pt presents to PT with an improving episode of chronic low back and R lat hip pain which has been doing so since starting meloxicam approx 1 month ago. Pain appears mechanical in nature and is reproduced most significantly c back extension and R side bending. The myotome screen was negative,  while pt's core is weak. Pt was started on a HEP. Pt will benefit from skilled PT 1w8 to address impairments to optimize back function with less pain.    OBJECTIVE IMPAIRMENTS: decreased activity tolerance, decreased strength, obesity, and pain.   ACTIVITY LIMITATIONS: carrying, lifting, bending, standing, and squatting  PARTICIPATION LIMITATIONS: cleaning, occupation, and yard work  PERSONAL FACTORS: Fitness, Past/current experiences, Time since onset of injury/illness/exacerbation, and 1-2 comorbidities: tobaccco use, high BMI  are also affecting patient's functional outcome.   REHAB POTENTIAL: Good  CLINICAL DECISION MAKING: Evolving/moderate complexity  EVALUATION COMPLEXITY: Moderate   GOALS:  SHORT TERM GOALS: Target date: 01/19/24  Pt will be Ind in an initial HEP Baseline: started Goal status: INITIAL  LONG TERM GOALS: Target date: 03/01/24  Pt will be Ind in a final HEP to maintain achieved LOF  Baseline:  started Goal status: INITIAL  2.  Pt will demonstrate improved core strength by tolerated a bridge for 90 sec and a plank from his toes for 45 sec Baseline: bridge 45 sec, plank 10 sec Goal status: INITIAL  3.  Pt will report 75% or greater improvement in his R low back and R lat hip pain for improved function and QOL. Baseline: 0-5/10 Goal status: INITIAL  4.  Pt will demonstrate proper body mechanics for lifting and household activities to minimize incidents of aggravating his low back pain.  Baseline:  Goal status: INITIAL  5.  Pt's FOTO score will improved to the predicted value of 73% as indication of improved function  Baseline: 52% Goal status: INITIAL  PLAN:  PT FREQUENCY: 1x/week  PT DURATION: 8 weeks  PLANNED INTERVENTIONS: 97164- PT Re-evaluation, 97110-Therapeutic exercises, 97530- Therapeutic activity, 97112- Neuromuscular re-education, 97535- Self Care, 82956- Manual therapy, 97014- Electrical stimulation (unattended), [id number]- Electrical  stimulation (manual), Patient/Family education, Taping, Dry Needling, Joint mobilization, Spinal mobilization, Cryotherapy, and Moist heat.  PLAN FOR NEXT SESSION: Review FOTO; assess response to HEP; progress therex as indicated; use of modalities, manual therapy; and TPDN as indicated.  Dequavius Kuhner MS, PT 01/03/24 5:47 PM

## 2024-01-03 ENCOUNTER — Ambulatory Visit (INDEPENDENT_AMBULATORY_CARE_PROVIDER_SITE_OTHER)
Admission: RE | Admit: 2024-01-03 | Discharge: 2024-01-03 | Disposition: A | Discharge status: Home / Self Care | Payer: 59 | Source: Ambulatory Visit | Attending: Family Medicine | Admitting: Family Medicine

## 2024-01-03 ENCOUNTER — Ambulatory Visit: Payer: 59

## 2024-01-03 DIAGNOSIS — M5136 Other intervertebral disc degeneration, lumbar region with discogenic back pain only: Secondary | ICD-10-CM | POA: Diagnosis not present

## 2024-01-03 DIAGNOSIS — M6281 Muscle weakness (generalized): Secondary | ICD-10-CM

## 2024-01-03 DIAGNOSIS — M545 Low back pain, unspecified: Secondary | ICD-10-CM

## 2024-01-03 DIAGNOSIS — M5127 Other intervertebral disc displacement, lumbosacral region: Secondary | ICD-10-CM | POA: Diagnosis not present

## 2024-01-03 DIAGNOSIS — M4316 Spondylolisthesis, lumbar region: Secondary | ICD-10-CM | POA: Diagnosis not present

## 2024-01-03 DIAGNOSIS — M4807 Spinal stenosis, lumbosacral region: Secondary | ICD-10-CM | POA: Diagnosis not present

## 2024-01-03 DIAGNOSIS — M5441 Lumbago with sciatica, right side: Secondary | ICD-10-CM | POA: Diagnosis not present

## 2024-01-03 DIAGNOSIS — G8929 Other chronic pain: Secondary | ICD-10-CM | POA: Diagnosis not present

## 2024-01-07 NOTE — Addendum Note (Signed)
Addended by: Meredith Staggers R on: 01/07/2024 02:39 PM   Modules accepted: Orders

## 2024-01-10 ENCOUNTER — Ambulatory Visit: Payer: 59 | Attending: Family Medicine

## 2024-01-10 DIAGNOSIS — M6281 Muscle weakness (generalized): Secondary | ICD-10-CM | POA: Insufficient documentation

## 2024-01-10 DIAGNOSIS — M5441 Lumbago with sciatica, right side: Secondary | ICD-10-CM | POA: Diagnosis present

## 2024-01-10 DIAGNOSIS — G8929 Other chronic pain: Secondary | ICD-10-CM | POA: Insufficient documentation

## 2024-01-10 NOTE — Therapy (Signed)
 OUTPATIENT PHYSICAL THERAPY THORACOLUMBAR TREATMENT   Patient Name: Ryan Kelly MRN: [id number] DOB:Dec 07, 1970, 53 y.o., male Today's Date: 01/11/2024  END OF SESSION:  PT End of Session - 01/11/24 0604     Visit Number 3    Number of Visits 13    Date for PT Re-Evaluation 03/01/24    Authorization Type [employer] [facility]    PT Start Time 1545    PT Stop Time 1630    PT Time Calculation (min) 45 min    Activity Tolerance Patient tolerated treatment well    Behavior During Therapy WFL for tasks assessed/performed               Past Medical History:  Diagnosis Date   Anxiety    Arthritis    Heartburn    past hx   Hyperlipidemia    Thyroid  disease    History reviewed. No pertinent surgical history. Patient Active Problem List   Diagnosis Date Noted   Primary osteoarthritis of first carpometacarpal joint of right hand 09/22/2022   Primary osteoarthritis of first carpometacarpal joint of left hand 09/22/2022    PCP: Levora Reyes SAUNDERS, MD   REFERRING PROVIDER: Levora Reyes SAUNDERS, MD   REFERRING DIAG: M54.50 (ICD-10-CM) - Recurrent low back pain   Rationale for Evaluation and Treatment: Rehabilitation  THERAPY DIAG:  Chronic right-sided low back pain with right-sided sciatica  Muscle weakness (generalized)  ONSET DATE: Since Sept 2024  SUBJECTIVE:                                                                                                                                                                                           SUBJECTIVE STATEMENT: Pt reports some soreness with doing more home renovation woek which required stooping, bending, being on hands and knees, and moving appliances.  EVAL: Pt reports he injured low back in Sept after lifting alot and overusing his low back. He also had an issue with increased low back pain around that time after extended driving. His low back was not getting better, but since receiving a prescription of  meloxicam  from Dr. Levora, which he has taken for approx 1 month, his low back is feeling better. Pt also endorses R gluteal/lateral hip pain and infrequent knee and ankle pain. Pt denies N/T. His prescription of meloxicam  runs out next week.  PERTINENT HISTORY:  Tobacco use, high BMI  PAIN:  Are you having pain? Yes: NPRS scale: 2/10; after physical activity: 5/10. Pain range the week prior to start of PT: 0-5/10. Pain location: R low back and lateral hip Pain description: ache Aggravating factors: Prolonged standing, lifting Relieving  factors: meloxicam , rest  PRECAUTIONS: None  RED FLAGS: None   WEIGHT BEARING RESTRICTIONS: No  FALLS:  Has patient fallen in last 6 months? 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 Levora  OBJECTIVE:  Note: Objective measures were completed at Evaluation unless otherwise noted.  DIAGNOSTIC FINDINGS:  Pt to have completed 01/03/24 DG Lumbar  FINDINGS: Minor broad-based dextroscoliotic curvature. Normal lumbar lordosis. Trace retrolisthesis of L5 on S1 and anterolisthesis of L4 on L5. Normal vertebral body heights. No fracture or compression deformity. Anterior spurring at multiple levels with mild L3-L4 and L5-S1 disc space narrowing. Moderate L3-L4, L4-L5, and L5-S1 facet hypertrophy. No pars defects or focal bone abnormalities.   IMPRESSION: 1. Mild degenerative disc disease at L3-L4 and L5-S1. 2. Moderate facet hypertrophy at L3-L4, L4-L5, and L5-S1. 3. Trace retrolisthesis of L5 on S1 and anterolisthesis of L4 on L5.  PATIENT SURVEYS:  FOTO: Perceived function   52%, predicted   73%   COGNITION: Overall cognitive status: Within functional limits for tasks assessed     SENSATION: WFL  MUSCLE LENGTH: Hamstrings: Right WNLs deg; Left WNLs  deg Debby test: Right WNLs deg; Left WNLs deg  POSTURE: No Significant postural limitations  PALPATION: Not overtly TTP of the low back or R hip  LUMBAR ROM:   AROM eval  Flexion Full; min p  Extension Full, p  Right lateral flexion Full, p  Left lateral flexion Full  Right rotation Full  Left rotation Full, min p  P=concordant pain  (Blank rows = not tested)  LOWER EXTREMITY ROM:    Grossly WNLs s reproduction of R hip pain Active  Right eval Left eval  Hip flexion    Hip extension    Hip abduction    Hip adduction    Hip internal rotation    Hip external rotation    Knee flexion    Knee extension    Ankle dorsiflexion    Ankle plantarflexion    Ankle inversion    Ankle eversion     (Blank rows = not tested)  LOWER EXTREMITY MMT:   Weak core= sustained bridge 45. Sustained plank 10 MMT Right eval Left eval  Hip flexion 5 5  Hip extension    Hip abduction 5 5  Hip adduction    Hip internal rotation    Hip external rotation 5 5  Knee flexion 5 5  Knee extension 5 5  Ankle dorsiflexion 5 5  Ankle plantarflexion 5 5  Ankle inversion    Ankle eversion     (Blank rows = not tested)  LUMBAR SPECIAL TESTS:  Straight leg raise test: Negative, Slump test: Negative, SI Compression/distraction test: Negative, and FABER test: Negative  FUNCTIONAL TESTS:  NT  GAIT: Distance walked: 200' Assistive device utilized: None Level of assistance: Complete Independence Comments: WNLs  TREATMENT DATE:  [facility] Adult PT Treatment:                                                DATE: 01/10/24 Therapeutic Exercise: TL Sidebending Stretch Single Arm Overhead 3 reps 10 hold Seated forward flexion x3 20' Supine Piriformis Stretch 2 reps - 20 hold Supine Transversus Abdominis Bracing 10 reps -  3 hold Hinged hip lifting 2x10 25# Modalities: Premod Estim at 10v for 15 mins c MH to R low back and gluteal areas Self Care: Pt Ed re: xray results from 01/03/24 for  understanding and correlation to PT intervention Hinged hip technique for lifting   [facility] Adult PT Treatment:                                                DATE: 01/03/24 Therapeutic Exercise: Hooklying Single Knee to Chest 2 reps - 20 hold Supine Piriformis Stretch 2 reps - 20 hold Supine Lower Trunk Rotation 10 reps - 5 hold Supine Transversus Abdominis Bracing 10 reps - 3 hold Supine Bridge  - 1-2 x daily 10 reps - 5 hold Right Standing Lateral Shift Correction at Wall 10 reps - 1 hold TL Sidebending Stretch Single Arm Overhead 3 reps 10 hold Supine 90/90 Abdominal Bracing 10 reps - 10 hold Prone Hip Extension 10 reps - 3 hold Updated HEP  [facility] Adult PT Treatment:                                                DATE: 12/27/23 Therapeutic Exercise: Developed, instructed in, and pt completed therex as noted in HEP   PATIENT EDUCATION:  Education details: Eval findings, POC, HEP Person educated: Patient Education method: Explanation, Demonstration, Tactile cues, Verbal cues, and Handouts Education comprehension: verbalized understanding, returned demonstration, verbal cues required, and tactile cues required  HOME EXERCISE PROGRAM: Access Code: [facility] URL: [email or web address] Date: 01/03/2024 Prepared by: Dasie Daft  Exercises - Hooklying Single Knee to Chest  - 1-2 x daily - 7 x weekly - 1 sets - 3 reps - 20 hold - Supine Piriformis Stretch with Foot on Ground  - 1-2 x daily - 7 x weekly - 1 sets - 3 reps - 20 hold - Supine Lower Trunk Rotation  - 1-2 x daily - 7 x weekly - 1 sets - 10 reps - 5 hold - Supine Transversus Abdominis Bracing - Hands on Ground  - 1-2 x daily - 7 x weekly - 2 sets - 10 reps - 3 hold - Supine Bridge  - 1-2 x daily - 7 x weekly - 2 sets - 10 reps - 5 hold - Right Standing Lateral Shift Correction at Wall - Hold  - 1-2 x daily - 7 x weekly - 2 sets - 10 reps - 1 hold - TL Sidebending Stretch - Single Arm Overhead  - 1-2 x daily - 7 x  weekly - 1 sets - 5 reps - 5-10 hold - Supine 90/90 Abdominal Bracing  - 1-2 x daily - 7 x weekly - 1 sets - 10 reps - 10 hold - Prone Hip Extension  - 1-2 x daily - 7 x weekly - 1 sets - 10 reps - 3 hold  ASSESSMENT:  CLINICAL IMPRESSION: PT was completed for PT ED re: xray results pt understanding and correlation to PT intervention and for hinged hip lifting. Additionally, therex for lumbopelvic flexibility and core strengthening was completed with hinged hip lifting started. Pt demonstrated proper hinged hip lifting. Premod  Estim was then provided for pain modulation. Post session, pt reported his low back and R gluteal  areas felt better. Overall, pt reports PT intervention has been benficial. Pt will continue to benefit from skilled PT to address impairments for improved back function c minimized pain.   EVAL: Patient is a 53 y.o. male who was seen today for physical therapy evaluation and treatment for M54.50 (ICD-10-CM) - Recurrent low back pain. Pt presents to PT with an improving episode of chronic low back and R lat hip pain which has been doing so since starting meloxicam  approx 1 month ago. Pain appears mechanical in nature and is reproduced most significantly c back extension and R side bending. The myotome screen was negative, while pt's core is weak. Pt was started on a HEP. Pt will benefit from skilled PT 1w8 to address impairments to optimize back function with less pain.    OBJECTIVE IMPAIRMENTS: decreased activity tolerance, decreased strength, obesity, and pain.   ACTIVITY LIMITATIONS: carrying, lifting, bending, standing, and squatting  PARTICIPATION LIMITATIONS: cleaning, occupation, and yard work  PERSONAL FACTORS: Fitness, Past/current experiences, Time since onset of injury/illness/exacerbation, and 1-2 comorbidities: tobaccco use, high BMI  are also affecting patient's functional outcome.   REHAB POTENTIAL: Good  CLINICAL DECISION MAKING: Evolving/moderate  complexity  EVALUATION COMPLEXITY: Moderate   GOALS:  SHORT TERM GOALS: Target date: 01/19/24  Pt will be Ind in an initial HEP Baseline: started Goal status: Ongoing  LONG TERM GOALS: Target date: 03/01/24  Pt will be Ind in a final HEP to maintain achieved LOF  Baseline: started Goal status: INITIAL  2.  Pt will demonstrate improved core strength by tolerated a bridge for 90 sec and a plank from his toes for 45 sec Baseline: bridge 45 sec, plank 10 sec Goal status: INITIAL  3.  Pt will report 75% or greater improvement in his R low back and R lat hip pain for improved function and QOL. Baseline: 0-5/10 Goal status: INITIAL  4.  Pt will demonstrate proper body mechanics for lifting and household activities to minimize incidents of aggravating his low back pain.  Baseline:  Goal status: INITIAL  5.  Pt's FOTO score will improved to the predicted value of 73% as indication of improved function  Baseline: 52% Goal status: INITIAL  PLAN:  PT FREQUENCY: 1x/week  PT DURATION: 8 weeks  PLANNED INTERVENTIONS: 97164- PT Re-evaluation, 97110-Therapeutic exercises, 97530- Therapeutic activity, 97112- Neuromuscular re-education, 97535- Self Care, [id number]- Manual therapy, 97014- Electrical stimulation (unattended), [id number]- Electrical stimulation (manual), Patient/Family education, Taping, Dry Needling, Joint mobilization, Spinal mobilization, Cryotherapy, and Moist heat.  PLAN FOR NEXT SESSION: Review FOTO; assess response to HEP; progress therex as indicated; use of modalities, manual therapy; and TPDN as indicated.  Ethan Clayburn MS, PT 01/11/24 6:23 AM

## 2024-01-11 ENCOUNTER — Encounter: Payer: Self-pay | Admitting: Family Medicine

## 2024-01-11 ENCOUNTER — Ambulatory Visit: Payer: 59 | Admitting: Family Medicine

## 2024-01-11 VITALS — BP 120/68 | HR 55 | Temp 98.3°F | Ht 70.0 in | Wt 226.0 lb

## 2024-01-11 DIAGNOSIS — M4310 Spondylolisthesis, site unspecified: Secondary | ICD-10-CM

## 2024-01-11 DIAGNOSIS — M545 Low back pain, unspecified: Secondary | ICD-10-CM | POA: Diagnosis not present

## 2024-01-11 DIAGNOSIS — M4316 Spondylolisthesis, lumbar region: Secondary | ICD-10-CM

## 2024-01-11 MED ORDER — MELOXICAM 7.5 MG PO TABS: 7.5000 mg | ORAL_TABLET | Freq: Every day | ORAL | 0 refills | Status: DC

## 2024-01-11 NOTE — Progress Notes (Signed)
 Subjective:  Patient ID: Ryan Kelly, male    DOB: [birth year]  Age: 53 y.o. MRN: [id number]  CC:  Chief Complaint  Patient presents with   Medical Management of Chronic Issues    Here for 6 wk back pain f/u.    HPI Ryan Kelly presents for   Back pain Follow-up from December 26 visit.  Recurrent low back pain discussed at that time, initially treated with meloxicam , physical therapy, imaging indicated spondylolisthesis of L4on 5, L5    on S1.  Referred to spine specialist. Imaging as below.  Mobic  once per day has been helpful.  PT helping as well.  No bowel or bladder incontinence, no saddle anesthesia, no lower extremity weakness.  PT yesterday. Sore in muscles.    DG Lumbar Spine Complete Result Date: 01/03/2024 CLINICAL DATA:  Right-sided low back pain. EXAM: LUMBAR SPINE - COMPLETE 4+ VIEW COMPARISON:  None Available. FINDINGS: Minor broad-based dextroscoliotic curvature. Normal lumbar lordosis. Trace retrolisthesis of L5 on S1 and anterolisthesis of L4 on L5. Normal vertebral body heights. No fracture or compression deformity. Anterior spurring at multiple levels with mild L3-L4 and L5-S1 disc space narrowing. Moderate L3-L4, L4-L5, and L5-S1 facet hypertrophy. No pars defects or focal bone abnormalities. IMPRESSION: 1. Mild degenerative disc disease at L3-L4 and L5-S1. 2. Moderate facet hypertrophy at L3-L4, L4-L5, and L5-S1. 3. Trace retrolisthesis of L5 on S1 and anterolisthesis of L4 on L5. Electronically Signed   By: Andrea Gasman M.D.   On: 01/03/2024 14:52     History Patient Active Problem List   Diagnosis Date Noted   Primary osteoarthritis of first carpometacarpal joint of right hand 09/22/2022   Primary osteoarthritis of first carpometacarpal joint of left hand 09/22/2022   Past Medical History:  Diagnosis Date   Anxiety    Arthritis    Heartburn    past hx   Hyperlipidemia    Thyroid  disease    History reviewed. No pertinent surgical  history. No Known Allergies Prior to Admission medications   Medication Sig Start Date End Date Taking? Authorizing Provider  busPIRone  (BUSPAR ) 5 MG tablet Take 1 tablet (5 mg total) by mouth daily as needed. 09/08/23  Yes Levora Reyes SAUNDERS, MD  fenofibrate  micronized (LOFIBRA) 200 MG capsule Take 1 capsule by mouth once daily 12/12/23  Yes Levora Reyes SAUNDERS, MD  levothyroxine  (SYNTHROID ) 137 MCG tablet Take 1 tablet (137 mcg total) by mouth daily. 09/08/23  Yes Levora Reyes SAUNDERS, MD  meloxicam  (MOBIC ) 7.5 MG tablet Take 1 tablet by mouth once daily 12/29/23  Yes Levora Reyes SAUNDERS, MD  sildenafil  (VIAGRA ) 100 MG tablet Take 0.5-1 tablets (50-100 mg total) by mouth daily as needed for erectile dysfunction. 09/08/23  Yes Levora Reyes SAUNDERS, MD   Social History   Socioeconomic History   Marital status: Married    Spouse name: Melisssa   Number of children: Not on file   Years of education: Not on file   Highest education level: Not on file  Occupational History   Not on file  Tobacco Use   Smoking status: Every Day    Current packs/day: 1.00    Types: Cigarettes   Smokeless tobacco: Never  Substance and Sexual Activity   Alcohol use: Yes    Comment: daily 6 or so   Drug use: Yes    Types: Marijuana    Comment: little   Sexual activity: Yes    Birth control/protection: None  Other Topics Concern   Not on file  Social History Narrative   Not on file   Social Drivers of Health   Financial Resource Strain: Not on file  Food Insecurity: Not on file  Transportation Needs: Not on file  Physical Activity: Not on file  Stress: Not on file  Social Connections: Not on file  Intimate Partner Violence: Not on file    Review of Systems   Objective:   Vitals:   01/11/24 0950  BP: 120/68  Pulse: (!) 55  Temp: 98.3 F (36.8 C)  TempSrc: Temporal  SpO2: 96%  Weight: 226 lb (102.5 kg)  Height: 5' 10 (1.778 m)     Physical Exam Vitals reviewed.  Constitutional:      General:  He is not in acute distress.    Appearance: Normal appearance. He is well-developed.  HENT:     Head: Normocephalic and atraumatic.  Cardiovascular:     Rate and Rhythm: Normal rate.  Pulmonary:     Effort: Pulmonary effort is normal.  Musculoskeletal:     Comments: Intact range of motion of the lumbar spine.  No focal bony tenderness or midline bony tenderness.  Ambulating without difficulty, able to heel and toe walk without difficulty and negative seated straight leg raise.  Neurological:     Mental Status: He is alert and oriented to person, place, and time.  Psychiatric:        Mood and Affect: Mood normal.        Assessment & Plan:  Ryan Kelly is a 53 y.o. male . Retrolisthesis  Recurrent low back pain - Plan: meloxicam  (MOBIC ) 7.5 MG tablet  Anterolisthesis of lumbar spine  Recurrent low back pain.  Improving with physical therapy and meloxicam .  Discussed imaging results including trace anterior listhesis and retrolisthesis of lower lumbar spine to S1.  Likely degenerative.  Has been referred to spine specialist to decide on change in treatment or advanced imaging but with current improvement will continue physical therapy, meloxicam  temporarily refilled.  RTC precautions given.  X-ray results were discussed with use of anatomy program on phone, all questions answered.  Meds ordered this encounter  Medications   meloxicam  (MOBIC ) 7.5 MG tablet    Sig: Take 1 tablet (7.5 mg total) by mouth daily.    Dispense:  30 tablet    Refill:  0   Patient Instructions  Glad to hear that you are improving.  Keep follow-up with spine specialist as planned.  I did refill meloxicam  temporarily.  Continue physical therapy for now.  If any new or worsening symptoms be seen, otherwise follow-up with me for a physical in April.  Take care    Signed,   Reyes Pines, MD [facility], [facility] 01/11/24 10:42 AM

## 2024-01-11 NOTE — Patient Instructions (Addendum)
 Glad to hear that you are improving.  Keep follow-up with spine specialist as planned.  I did refill meloxicam  temporarily.  Continue physical therapy for now.  If any new or worsening symptoms be seen, otherwise follow-up with me for a physical in April.  Take care

## 2024-01-17 ENCOUNTER — Encounter: Payer: Self-pay | Admitting: Physical Therapy

## 2024-01-17 ENCOUNTER — Ambulatory Visit: Payer: 59 | Admitting: Physical Therapy

## 2024-01-17 DIAGNOSIS — M5441 Lumbago with sciatica, right side: Secondary | ICD-10-CM | POA: Diagnosis not present

## 2024-01-17 DIAGNOSIS — M6281 Muscle weakness (generalized): Secondary | ICD-10-CM

## 2024-01-17 DIAGNOSIS — G8929 Other chronic pain: Secondary | ICD-10-CM

## 2024-01-17 NOTE — Therapy (Addendum)
 OUTPATIENT PHYSICAL THERAPY THORACOLUMBAR TREATMENT/DC   Patient Name: Ryan Kelly MRN: [id number] DOB:06-24-1971, 53 y.o., male Today's Date: 01/17/2024  END OF SESSION:  PT End of Session - 01/17/24 1448     Visit Number 4    Number of Visits 13    Date for PT Re-Evaluation 03/01/24    Authorization Type [employer] [facility]    PT Start Time 0245    PT Stop Time 0325    PT Time Calculation (min) 40 min               Past Medical History:  Diagnosis Date   Anxiety    Arthritis    Heartburn    past hx   Hyperlipidemia    Thyroid  disease    History reviewed. No pertinent surgical history. Patient Active Problem List   Diagnosis Date Noted   Primary osteoarthritis of first carpometacarpal joint of right hand 09/22/2022   Primary osteoarthritis of first carpometacarpal joint of left hand 09/22/2022    PCP: Levora Reyes SAUNDERS, MD   REFERRING PROVIDER: Levora Reyes SAUNDERS, MD   REFERRING DIAG: M54.50 (ICD-10-CM) - Recurrent low back pain   Rationale for Evaluation and Treatment: Rehabilitation  THERAPY DIAG:  Chronic right-sided low back pain with right-sided sciatica  Muscle weakness (generalized)  ONSET DATE: Since Sept 2024  SUBJECTIVE:                                                                                                                                                                                           SUBJECTIVE STATEMENT: No pain on arrival. Hurts more later in the day and with installing floors.   EVAL: Pt reports he injured low back in Sept after lifting alot and overusing his low back. He also had an issue with increased low back pain around that time after extended driving. His low back was not getting better, but since receiving a prescription of meloxicam  from Dr. Levora, which he has taken for approx 1 month, his low back is feeling better. Pt also endorses R gluteal/lateral hip pain and infrequent knee and ankle pain. Pt  denies N/T. His prescription of meloxicam  runs out next week.  PERTINENT HISTORY:  Tobacco use, high BMI  PAIN:  Are you having pain? Yes: NPRS scale: 0/10; after physical activity: 5/10. Pain range the week prior to start of PT: 0-5/10. Pain location: R low back and lateral hip Pain description: ache Aggravating factors: Prolonged standing, lifting Relieving factors: meloxicam , rest  PRECAUTIONS: None  RED FLAGS: None   WEIGHT BEARING RESTRICTIONS: No  FALLS:  Has patient fallen in last 6 months?  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 Levora  OBJECTIVE:  Note: Objective measures were completed at Evaluation unless otherwise noted.  DIAGNOSTIC FINDINGS:  Pt to have completed 01/03/24 DG Lumbar  FINDINGS: Minor broad-based dextroscoliotic curvature. Normal lumbar lordosis. Trace retrolisthesis of L5 on S1 and anterolisthesis of L4 on L5. Normal vertebral body heights. No fracture or compression deformity. Anterior spurring at multiple levels with mild L3-L4 and L5-S1 disc space narrowing. Moderate L3-L4, L4-L5, and L5-S1 facet hypertrophy. No pars defects or focal bone abnormalities.   IMPRESSION: 1. Mild degenerative disc disease at L3-L4 and L5-S1. 2. Moderate facet hypertrophy at L3-L4, L4-L5, and L5-S1. 3. Trace retrolisthesis of L5 on S1 and anterolisthesis of L4 on L5.  PATIENT SURVEYS:  FOTO: Perceived function   52%, predicted   73%   COGNITION: Overall cognitive status: Within functional limits for tasks assessed     SENSATION: WFL  MUSCLE LENGTH: Hamstrings: Right WNLs deg; Left WNLs deg Debby test: Right WNLs deg; Left WNLs deg  POSTURE: No Significant postural limitations  PALPATION: Not overtly TTP of the low back or R hip  LUMBAR ROM:   AROM eval   Flexion Full; min p  Extension Full, p  Right lateral flexion Full, p  Left lateral flexion Full  Right rotation Full  Left rotation Full, min p  P=concordant pain  (Blank rows = not tested)  LOWER EXTREMITY ROM:    Grossly WNLs s reproduction of R hip pain Active  Right eval Left eval  Hip flexion    Hip extension    Hip abduction    Hip adduction    Hip internal rotation    Hip external rotation    Knee flexion    Knee extension    Ankle dorsiflexion    Ankle plantarflexion    Ankle inversion    Ankle eversion     (Blank rows = not tested)  LOWER EXTREMITY MMT:   Weak core= sustained bridge 45. Sustained plank 10 MMT Right eval Left eval  Hip flexion 5 5  Hip extension    Hip abduction 5 5  Hip adduction    Hip internal rotation    Hip external rotation 5 5  Knee flexion 5 5  Knee extension 5 5  Ankle dorsiflexion 5 5  Ankle plantarflexion 5 5  Ankle inversion    Ankle eversion     (Blank rows = not tested)  LUMBAR SPECIAL TESTS:  Straight leg raise test: Negative, Slump test: Negative, SI Compression/distraction test: Negative, and FABER test: Negative  FUNCTIONAL TESTS:  NT  GAIT: Distance walked: 200' Assistive device utilized: None Level of assistance: Complete Independence Comments: WNLs  TREATMENT DATE:  [facility] Adult PT Treatment:                                                DATE: 01/17/24 Therapeutic Exercise: SKTC PPT PPT with bent knee fall outs SKTC PPT to bridge  LTR Supine QL stretch from figure 4 x 3 each  Supine wide LTR  Supine 90/90 Abdominal Bracing 5 reps - 10 hold Supine Piriformis Stretch 2 reps - 20 hold Forearm plank on knees 15 sec x 2 , 1  x 30 sec  SKTC Updated HEP     [facility] Adult PT Treatment:                                                DATE: 01/10/24 Therapeutic Exercise: TL Sidebending Stretch Single Arm Overhead 3 reps 10 hold Seated forward flexion x3 20' Supine Piriformis Stretch 2 reps - 20  hold Supine Transversus Abdominis Bracing 10 reps - 3 hold Hinged hip lifting 2x10 25# Modalities: Premod Estim at 10v for 15 mins c MH to R low back and gluteal areas Self Care: Pt Ed re: xray results from 01/03/24 for understanding and correlation to PT intervention Hinged hip technique for lifting   [facility] Adult PT Treatment:                                                DATE: 01/03/24 Therapeutic Exercise: Hooklying Single Knee to Chest 2 reps - 20 hold Supine Piriformis Stretch 2 reps - 20 hold Supine Lower Trunk Rotation 10 reps - 5 hold Supine Transversus Abdominis Bracing 10 reps - 3 hold Supine Bridge  - 1-2 x daily 10 reps - 5 hold Right Standing Lateral Shift Correction at Wall 10 reps - 1 hold TL Sidebending Stretch Single Arm Overhead 3 reps 10 hold Supine 90/90 Abdominal Bracing 10 reps - 10 hold Prone Hip Extension 10 reps - 3 hold Updated HEP  [facility] Adult PT Treatment:                                                DATE: 12/27/23 Therapeutic Exercise: Developed, instructed in, and pt completed therex as noted in HEP   PATIENT EDUCATION:  Education details: Eval findings, POC, HEP Person educated: Patient Education method: Explanation, Demonstration, Tactile cues, Verbal cues, and Handouts Education comprehension: verbalized understanding, returned demonstration, verbal cues required, and tactile cues required  HOME EXERCISE PROGRAM: Access Code: [facility] URL: [email or web address] Date: 01/03/2024 Prepared by: Dasie Daft  Exercises - Hooklying Single Knee to Chest  - 1-2 x daily - 7 x weekly - 1 sets - 3 reps - 20 hold - Supine Piriformis Stretch with Foot on Ground  - 1-2 x daily - 7 x weekly - 1 sets - 3 reps - 20 hold - Supine Lower Trunk Rotation  - 1-2 x daily - 7 x weekly - 1 sets - 10 reps - 5 hold - Supine Transversus Abdominis Bracing - Hands on Ground  - 1-2 x daily - 7 x weekly - 2 sets - 10 reps - 3 hold - Supine Bridge  - 1-2 x daily -  7 x weekly - 2 sets - 10 reps - 5 hold - Right Standing Lateral Shift Correction at Wall - Hold  - 1-2 x daily - 7 x weekly - 2 sets - 10 reps - 1 hold - TL Sidebending Stretch - Single Arm Overhead  - 1-2 x daily - 7 x weekly - 1 sets - 5 reps - 5-10 hold - Supine 90/90 Abdominal Bracing  - 1-2 x daily - 7 x weekly -  1 sets - 10 reps - 10 hold - Prone Hip Extension  - 1-2 x daily - 7 x weekly - 1 sets - 10 reps - 3 hold  ASSESSMENT:  CLINICAL IMPRESSION: PT was provided for lumbo pelvic stability and mobility with pt education throughout session. He reports that the estim was helpful last session. Introduced forearm plank from knees to improve core stability. Used mirror for feedback on correct positioning. Pt did not have increased pain. Updated HEP.   Pt will continue to benefit from skilled PT to address impairments for improved back function c minimized pain. Pt needs to check his schedule before making more appts.   EVAL: Patient is a 53 y.o. male who was seen today for physical therapy evaluation and treatment for M54.50 (ICD-10-CM) - Recurrent low back pain. Pt presents to PT with an improving episode of chronic low back and R lat hip pain which has been doing so since starting meloxicam  approx 1 month ago. Pain appears mechanical in nature and is reproduced most significantly c back extension and R side bending. The myotome screen was negative, while pt's core is weak. Pt was started on a HEP. Pt will benefit from skilled PT 1w8 to address impairments to optimize back function with less pain.    OBJECTIVE IMPAIRMENTS: decreased activity tolerance, decreased strength, obesity, and pain.   ACTIVITY LIMITATIONS: carrying, lifting, bending, standing, and squatting  PARTICIPATION LIMITATIONS: cleaning, occupation, and yard work  PERSONAL FACTORS: Fitness, Past/current experiences, Time since onset of injury/illness/exacerbation, and 1-2 comorbidities: tobaccco use, high BMI are also affecting  patient's functional outcome.   REHAB POTENTIAL: Good  CLINICAL DECISION MAKING: Evolving/moderate complexity  EVALUATION COMPLEXITY: Moderate   GOALS:  SHORT TERM GOALS: Target date: 01/19/24  Pt will be Ind in an initial HEP Baseline: started Goal status: Ongoing  LONG TERM GOALS: Target date: 03/01/24  Pt will be Ind in a final HEP to maintain achieved LOF  Baseline: started Goal status: INITIAL  2.  Pt will demonstrate improved core strength by tolerated a bridge for 90 sec and a plank from his toes for 45 sec Baseline: bridge 45 sec, plank 10 sec Goal status: INITIAL  3.  Pt will report 75% or greater improvement in his R low back and R lat hip pain for improved function and QOL. Baseline: 0-5/10 Goal status: INITIAL  4.  Pt will demonstrate proper body mechanics for lifting and household activities to minimize incidents of aggravating his low back pain.  Baseline:  Goal status: INITIAL  5.  Pt's FOTO score will improved to the predicted value of 73% as indication of improved function  Baseline: 52% Goal status: INITIAL  PLAN:  PT FREQUENCY: 1x/week  PT DURATION: 8 weeks  PLANNED INTERVENTIONS: 97164- PT Re-evaluation, 97110-Therapeutic exercises, 97530- Therapeutic activity, 97112- Neuromuscular re-education, 97535- Self Care, [id number]- Manual therapy, 97014- Electrical stimulation (unattended), [id number]- Electrical stimulation (manual), Patient/Family education, Taping, Dry Needling, Joint mobilization, Spinal mobilization, Cryotherapy, and Moist heat.  PLAN FOR NEXT SESSION: Review FOTO; assess response to HEP; progress therex as indicated; use of modalities, manual therapy; and TPDN as indicated.  Harlene Persons, PTA 01/17/24 3:49 PM Phone: [phone] Fax: [phone]   PHYSICAL THERAPY DISCHARGE SUMMARY  Visits from Start of Care: 4  Current functional level related to goals / functional outcomes: Unknown   Remaining deficits: Unknown   Education /  Equipment: HEP/Pt ED   Patient agrees to discharge. Patient goals were not met. Patient is being discharged due to not  returning since the last visit.  Allen Ralls MS, PT 11/21/2024 9:18 AM

## 2024-01-22 DIAGNOSIS — D171 Benign lipomatous neoplasm of skin and subcutaneous tissue of trunk: Secondary | ICD-10-CM | POA: Diagnosis not present

## 2024-01-24 ENCOUNTER — Ambulatory Visit: Payer: 59

## 2024-02-02 ENCOUNTER — Other Ambulatory Visit: Payer: Self-pay | Admitting: Family Medicine

## 2024-02-02 DIAGNOSIS — E039 Hypothyroidism, unspecified: Secondary | ICD-10-CM

## 2024-02-26 ENCOUNTER — Other Ambulatory Visit: Payer: Self-pay | Admitting: Family Medicine

## 2024-02-26 DIAGNOSIS — M545 Low back pain, unspecified: Secondary | ICD-10-CM

## 2024-02-27 ENCOUNTER — Encounter: Payer: Self-pay | Admitting: *Deleted

## 2024-03-08 ENCOUNTER — Encounter: Payer: 59 | Admitting: Family Medicine

## 2024-03-15 ENCOUNTER — Encounter: Payer: 59 | Admitting: Family Medicine

## 2024-03-28 ENCOUNTER — Other Ambulatory Visit: Payer: Self-pay | Admitting: Family Medicine

## 2024-03-28 DIAGNOSIS — E782 Mixed hyperlipidemia: Secondary | ICD-10-CM

## 2024-03-28 DIAGNOSIS — E781 Pure hyperglyceridemia: Secondary | ICD-10-CM

## 2024-03-31 ENCOUNTER — Other Ambulatory Visit: Payer: Self-pay | Admitting: Family Medicine

## 2024-03-31 DIAGNOSIS — M545 Low back pain, unspecified: Secondary | ICD-10-CM

## 2024-04-01 NOTE — Telephone Encounter (Signed)
 Requested Prescriptions   Pending Prescriptions Disp Refills   meloxicam  (MOBIC ) 7.5 MG tablet [Pharmacy Med Name: Meloxicam  7.5 MG Oral Tablet] 30 tablet 0    Sig: Take 1 tablet by mouth once daily     Date of patient request: 04/01/2024 Last office visit: 01/11/2024 Upcoming visit: Visit date not found Date of last refill: 02/26/2024 Last refill amount: 30

## 2024-05-06 ENCOUNTER — Other Ambulatory Visit: Payer: Self-pay | Admitting: Family Medicine

## 2024-05-06 DIAGNOSIS — E039 Hypothyroidism, unspecified: Secondary | ICD-10-CM

## 2024-06-22 ENCOUNTER — Other Ambulatory Visit: Payer: Self-pay | Admitting: Family Medicine

## 2024-06-22 DIAGNOSIS — M545 Low back pain, unspecified: Secondary | ICD-10-CM

## 2024-06-24 NOTE — Telephone Encounter (Signed)
 I will refill temporarily but please schedule for follow-up office visit.

## 2024-06-24 NOTE — Telephone Encounter (Signed)
 Patient is requesting a refill on Mobic . Notes from last visit in February stated it was a temporary fill. Okay to refill? Patient needs a follow up appt with you. I can get that scheduled.

## 2024-06-24 NOTE — Telephone Encounter (Signed)
 Called patient to schedule a follow up visit with Dr. Levora. Unable to leave vm due to mailbox being full. Dr. Levora sent in 30 days supply for mobic .

## 2024-06-25 NOTE — Telephone Encounter (Signed)
 Called patient to schedule appt, VM full

## 2024-06-26 ENCOUNTER — Encounter: Payer: Self-pay | Admitting: Family Medicine

## 2024-06-26 NOTE — Telephone Encounter (Signed)
 Called pt, VM full, will send letter out asking to contact us  to schedule an appointment since he was unreachable.

## 2024-07-26 ENCOUNTER — Other Ambulatory Visit: Payer: Self-pay | Admitting: Family Medicine

## 2024-07-26 DIAGNOSIS — M545 Low back pain, unspecified: Secondary | ICD-10-CM

## 2024-07-26 NOTE — Telephone Encounter (Signed)
 Requested Prescriptions   Pending Prescriptions Disp Refills   meloxicam  (MOBIC ) 7.5 MG tablet [Pharmacy Med Name: Meloxicam  7.5 MG Oral Tablet] 30 tablet 0    Sig: TAKE 1 TABLET BY MOUTH ONCE DAILY. PLEASE  FOLLOW-UP  FOR  OFFICE  VISIT.     Date of patient request: 07/26/2024 Last office visit: 01/11/2024 Upcoming visit: Visit date not found Date of last refill: 06/24/2024 Last refill amount: 30

## 2024-07-26 NOTE — Telephone Encounter (Signed)
 On his July 21 refill request, I prescribed a temporary supply but advised needs office visit.  Looks like there was some difficulty contacting patient to schedule appointment.  Will refuse refill at this time with notification to pharmacy that patient needs appointment.  Thanks.

## 2024-07-26 NOTE — Telephone Encounter (Signed)
 Attempted call to patient, no answer, unable to LM VM full

## 2024-08-20 ENCOUNTER — Other Ambulatory Visit: Payer: Self-pay | Admitting: Family Medicine

## 2024-08-20 DIAGNOSIS — E039 Hypothyroidism, unspecified: Secondary | ICD-10-CM

## 2024-08-20 NOTE — Telephone Encounter (Signed)
 Pt need appt

## 2024-08-20 NOTE — Telephone Encounter (Signed)
 Agree, due for follow-up but will refill meds for 1 additional time, needs office visit.  Thanks

## 2024-08-21 ENCOUNTER — Other Ambulatory Visit: Payer: Self-pay | Admitting: Family Medicine

## 2024-08-21 DIAGNOSIS — E781 Pure hyperglyceridemia: Secondary | ICD-10-CM

## 2024-08-21 DIAGNOSIS — E782 Mixed hyperlipidemia: Secondary | ICD-10-CM

## 2024-08-21 NOTE — Telephone Encounter (Signed)
 Patient is due for a f/u. Will call later to schedule

## 2024-08-21 NOTE — Telephone Encounter (Signed)
 Called to schedule, no answer, VM full unable to LM

## 2024-08-22 NOTE — Telephone Encounter (Signed)
 Called to schedule, no answer, VM full unable to LM

## 2024-11-15 ENCOUNTER — Other Ambulatory Visit: Payer: Self-pay | Admitting: Family Medicine

## 2024-11-15 DIAGNOSIS — E039 Hypothyroidism, unspecified: Secondary | ICD-10-CM
